# Patient Record
Sex: Male | Born: 1970 | Hispanic: Refuse to answer | Smoking: Current every day smoker
Health system: Southern US, Community
[De-identification: ages and names within clinical notes are randomized; demographics above are authoritative.]

## PROBLEM LIST (undated history)

## (undated) DIAGNOSIS — I219 Acute myocardial infarction, unspecified: Secondary | ICD-10-CM

## (undated) HISTORY — PX: CORONARY STENT PLACEMENT: SHX1402

---

## 2001-08-08 ENCOUNTER — Emergency Department (HOSPITAL_COMMUNITY): Admission: EM | Admit: 2001-08-08 | Discharge: 2001-08-08 | Payer: Self-pay | Admitting: Emergency Medicine

## 2005-12-12 ENCOUNTER — Inpatient Hospital Stay (HOSPITAL_COMMUNITY): Admission: EM | Admit: 2005-12-12 | Discharge: 2005-12-14 | Payer: Self-pay | Admitting: Emergency Medicine

## 2005-12-12 ENCOUNTER — Ambulatory Visit: Payer: Self-pay | Admitting: Internal Medicine

## 2005-12-13 ENCOUNTER — Encounter: Payer: Self-pay | Admitting: Cardiovascular Disease

## 2005-12-25 ENCOUNTER — Ambulatory Visit: Payer: Self-pay | Admitting: Cardiovascular Disease

## 2006-03-10 ENCOUNTER — Emergency Department (HOSPITAL_COMMUNITY): Admission: EM | Admit: 2006-03-10 | Discharge: 2006-03-10 | Payer: Self-pay | Admitting: Emergency Medicine

## 2006-03-12 ENCOUNTER — Observation Stay (HOSPITAL_COMMUNITY): Admission: AD | Admit: 2006-03-12 | Discharge: 2006-03-13 | Payer: Self-pay | Admitting: Otolaryngology

## 2006-03-19 ENCOUNTER — Ambulatory Visit: Payer: Self-pay | Admitting: Cardiovascular Disease

## 2007-06-19 ENCOUNTER — Ambulatory Visit: Payer: Self-pay | Admitting: Cardiology

## 2007-06-19 ENCOUNTER — Encounter: Payer: Self-pay | Admitting: Emergency Medicine

## 2007-06-19 ENCOUNTER — Inpatient Hospital Stay (HOSPITAL_COMMUNITY): Admission: AD | Admit: 2007-06-19 | Discharge: 2007-06-21 | Payer: Self-pay | Admitting: Cardiology

## 2007-06-26 ENCOUNTER — Ambulatory Visit: Payer: Self-pay

## 2007-07-24 ENCOUNTER — Ambulatory Visit: Payer: Self-pay | Admitting: Cardiology

## 2007-11-13 ENCOUNTER — Emergency Department (HOSPITAL_COMMUNITY): Admission: EM | Admit: 2007-11-13 | Discharge: 2007-11-13 | Payer: Self-pay | Admitting: Emergency Medicine

## 2007-11-17 ENCOUNTER — Inpatient Hospital Stay (HOSPITAL_COMMUNITY): Admission: EM | Admit: 2007-11-17 | Discharge: 2007-11-22 | Payer: Self-pay | Admitting: Emergency Medicine

## 2007-11-23 ENCOUNTER — Inpatient Hospital Stay (HOSPITAL_COMMUNITY): Admission: EM | Admit: 2007-11-23 | Discharge: 2007-11-26 | Payer: Self-pay | Admitting: Emergency Medicine

## 2008-12-20 ENCOUNTER — Emergency Department (HOSPITAL_COMMUNITY): Admission: EM | Admit: 2008-12-20 | Discharge: 2008-12-20 | Payer: Self-pay | Admitting: Emergency Medicine

## 2009-09-07 ENCOUNTER — Emergency Department (HOSPITAL_COMMUNITY): Admission: EM | Admit: 2009-09-07 | Discharge: 2009-09-07 | Payer: Self-pay | Admitting: Emergency Medicine

## 2009-09-09 ENCOUNTER — Emergency Department (HOSPITAL_COMMUNITY): Admission: EM | Admit: 2009-09-09 | Discharge: 2009-09-09 | Payer: Self-pay | Admitting: Emergency Medicine

## 2009-09-11 ENCOUNTER — Emergency Department (HOSPITAL_COMMUNITY): Admission: EM | Admit: 2009-09-11 | Discharge: 2009-09-11 | Payer: Self-pay | Admitting: Emergency Medicine

## 2009-09-13 ENCOUNTER — Emergency Department (HOSPITAL_COMMUNITY): Admission: EM | Admit: 2009-09-13 | Discharge: 2009-09-13 | Payer: Self-pay | Admitting: Emergency Medicine

## 2010-03-06 ENCOUNTER — Encounter: Payer: Self-pay | Admitting: Cardiovascular Disease

## 2010-04-30 LAB — DIFFERENTIAL
Eosinophils Relative: 0 % (ref 0–5)
Lymphocytes Relative: 13 % (ref 12–46)
Lymphs Abs: 1.5 10*3/uL (ref 0.7–4.0)
Monocytes Relative: 8 % (ref 3–12)

## 2010-04-30 LAB — POCT I-STAT, CHEM 8
BUN: 8 mg/dL (ref 6–23)
Chloride: 104 mEq/L (ref 96–112)
Glucose, Bld: 90 mg/dL (ref 70–99)
HCT: 43 % (ref 39.0–52.0)
Hemoglobin: 14.6 g/dL (ref 13.0–17.0)
Sodium: 143 mEq/L (ref 135–145)

## 2010-04-30 LAB — CBC
Hemoglobin: 12.5 g/dL — ABNORMAL LOW (ref 13.0–17.0)
MCH: 32 pg (ref 26.0–34.0)
MCHC: 34.9 g/dL (ref 30.0–36.0)
MCV: 91.6 fL (ref 78.0–100.0)
RBC: 3.9 MIL/uL — ABNORMAL LOW (ref 4.22–5.81)
RDW: 14 % (ref 11.5–15.5)

## 2010-04-30 LAB — RAPID STREP SCREEN (MED CTR MEBANE ONLY): Streptococcus, Group A Screen (Direct): NEGATIVE

## 2010-05-18 LAB — POCT I-STAT, CHEM 8
BUN: 12 mg/dL (ref 6–23)
Glucose, Bld: 88 mg/dL (ref 70–99)
Sodium: 136 mEq/L (ref 135–145)
TCO2: 30 mmol/L (ref 0–100)

## 2010-05-18 LAB — POCT CARDIAC MARKERS: Troponin i, poc: <0.05 ng/mL (ref 0.00–0.09)

## 2010-06-28 NOTE — Discharge Summary (Signed)
Aaron Barajas, Aaron Barajas NO.:  [id number]   MEDICAL RECORD NO.:  [id number]          PATIENT TYPE:  INP   LOCATION:  1519                         FACILITY:  [facility]   PHYSICIAN:  Theodosia Paling, MD    DATE OF BIRTH:  Jun 28, 1970   DATE OF ADMISSION:  11/23/2007  DATE OF DISCHARGE:  11/26/2007                               DISCHARGE SUMMARY   ADMITTING HISTORY:  Please refer to admission note dictated by Dr. Lucita Ferrara on the 10th of October 2009.   HOSPITAL COURSE:  The following issues were addressed during the  hospitalization.  1. Pneumococcal pneumonia with positive bacteremia, documented last      admission.  I started the patient on combination of ceftriaxone and      Levaquin.  The patient had a very high bacterial load in the sputum      as well as he had active bacteremia recently and he was coming with      the high fever and chills and leukocytosis.  The patient's WBC      count and fever normalized.  At the time of discharge he is no more      febrile.  He was hemodynamically stable.  He feels significantly      better.  I am discharging him home on a high dose of Levaquin 750      mg p.o. daily to be completed for 12 days.  He is going to also      receive pneumococcal vaccination at the time of discharge.  Also      the patient has significantly better appetite.  2. Clostridium difficile colitis.  The patient had diarrhea at the      time of admission.  A Clostridium difficile toxin was performed on      stool which was positive.  He was started on Flagyl.  His diarrhea      has resolved.  He will complete 8 more days of Flagyl for the      completion of the 10 days of therapy for C. diff colitis.  3. CAD.  His CAD stayed stable throughout the hospitalization.  4. Hypoalbuminemia.  The patient was found to have a very low albumin,      most likely secondary to the high catabolic state secondary to      pneumococcal pneumonia.  He was started on Ensure  supplements.  I      recommend him to continue this even at home.   DISCHARGE MEDICATIONS:  At the time of discharge the patient is going  home on the following medications:  1. Levaquin 750 mg p.o. daily for 12 days.  2. Flagyl 500 mg p.o. q.8 hours for the next 8 days.  3. Ensure one can every 8 hours.  4. Tylenol 650 mg p.o. q.6-8 hours as needed.  5. Continue home medication Tricor 145 mg p.o. daily.   DIAGNOSES:  1. Pneumococcal pneumonia with a history of positive bacteremia.  2. Clostridium difficile colitis.  3. Malnutrition.  4. hyperlipidemia history.   IMAGING PERFORMED:  Chest x-ray done on  11/23/07 showing bilateral  pneumonia.   PROCEDURE PERFORMED:  None.   CONSULTATIONS:  none.   DISPOSITION:  The patient does not have a primary care physician at this  time.  He has applied for Medicaid.  Will involve case manager to  establish primary care outpatient appointment for him with whom he will  followup in one week time with repeat CBC and clinical exam followup.   Total length spent on care and discharge of this patient around 45  minutes.      Theodosia Paling, MD  Electronically Signed     NP/MEDQ  D:  11/26/2007  T:  11/26/2007  Job:  [id number] [id number]

## 2010-06-28 NOTE — Discharge Summary (Signed)
[name], Aaron Barajas NO.:  [id number]   MEDICAL RECORD NO.:  [id number]          PATIENT TYPE:  INP   LOCATION:  2027                         FACILITY:  [facility]   PHYSICIAN:  Aaron Fells. Excell Seltzer, MD  DATE OF BIRTH:  [birth year]   DATE OF ADMISSION:  06/19/2007  DATE OF DISCHARGE:  06/21/2007                               DISCHARGE SUMMARY   PRIMARY CARDIOLOGIST:  Aaron Fells. Excell Seltzer, MD   PRIMARY CARE Aaron Barajas:  None.   DISCHARGE DIAGNOSIS:  Chest pain.   SECONDARY DIAGNOSES:  1. Coronary artery disease, status post percutaneous coronary      intervention and stenting of left circumflex with bare metal stent      in November 2007.  2. History of ruptured lumbar disk status post surgery.  3. Ongoing tobacco abuse.   ALLERGIES:  PENICILLIN.   PROCEDURE:  Left heart cardiac catheterization.   HISTORY OF PRESENT ILLNESS:  A 40 year old African American male with a  prior history of CAD who presented to the [facility] ED on Jun 19, 2007  with a 3-week history of episodic chest discomfort that occurred with  exertion and resolved with rest.  In the ED, ECG showed no acute changes  and the patient was admitted for further evaluation.   HOSPITAL COURSE:  The patient ruled out for MI and underwent left heart  cardiac catheterization on Jun 20, 2007 revealing 40%-50% stenosis in the  left main as well as diffusely throughout the right coronary artery.  Previously placed circumflex stent was widely patent and LV function was  normal.  There were no targets intervention.  The patient to manage  medically.  We have arranged for outpatient exercise Myoview take place  on Jun 25, 2007, at 7:30 a.m. at [facility] to evaluate the ischemic  burden of the left main and right coronary artery stenosis.  The patient  did not had any recurrent chest discomfort.  He has been ambulating  without difficulty and being discharged home today in condition.   DISCHARGE LABS:  Hemoglobin  13.0, hematocrit 38.2, WBC 8.4, platelets  146, MCV 92.4, sodium 140, potassium 3.6, chloride 104, CO2 28, BUN 7,  creatinine 0.8, glucose 77, INR 1.1.  Cardiac markers negative x3.  Calcium 8.8.   DISPOSITION:  The patient is being discharged home today in good  condition.   FOLLOWUP PLANS AND APPOINTMENTS:  We have arranged for followup exercise  Myoview on Jun 25, 2007 at 7:30 a.m.  We will also arrange for followup  with Dr. Excell Barajas in roughly 2 weeks.   DISCHARGE MEDICATIONS:  1. Aspirin 81 mg daily.  2. Zocor 40 mg nightly.   OUTSTANDING LAB STUDIES:  None.   DURATION DISCHARGE Neta Mends:  Forty five minutes including physician  time.      Aaron Barajas, ANP      Aaron Fells. Excell Seltzer, MD  Electronically Signed    CB/MEDQ  D:  06/21/2007  T:  06/22/2007  Job:  [id number]

## 2010-06-28 NOTE — Discharge Summary (Signed)
NAMEJOSEALBERTO, Aaron Barajas              ACCOUNT NO.:  [id number]   MEDICAL RECORD NO.:  [id number]          PATIENT TYPE:  INP   LOCATION:  1417                         FACILITY:  [facility]   PHYSICIAN:  Hillery Aldo, M.D.   DATE OF BIRTH:  April 15, 1970   DATE OF ADMISSION:  11/17/2007  DATE OF DISCHARGE:  11/22/2007                               DISCHARGE SUMMARY   PRIMARY CARE PHYSICIAN:  None.   CARDIOLOGIST:  Dr. Excell Seltzer.   DISCHARGE DIAGNOSES:  1. Streptococcal pneumonia sepsis.  2. Multilobar pneumonia.  3. Hypertriglyceridemia.  4. Moderate protein calorie malnutrition.  5. Coronary artery disease.   DISCHARGE MEDICATIONS:  1. Aspirin 81 mg daily.  2. Avelox 400 mg daily times 10 days.  3. Tricor 145 mg daily.   CONSULTATIONS:  None.   BRIEF ADMISSION HPI:  The patient is a 40 year old male who presented to  the hospital with a chief complaint of fevers, chills, and weight loss.  He had been sick for approximately 3 weeks prior to presentation and his  temperature max was noted to be 104.  He also complained of a poor  appetite and little p.o. intake.  He was admitted for further evaluation  and workup.  For the full details, please see the dictated report done  by Dr. Ashley Royalty.   PROCEDURES AND DIAGNOSTIC STUDIES:  1. Chest x-ray on November 17, 2007 showed asymmetrical bilateral air      space consolidation consistent with pneumonia.  2. Repeat chest x-ray on November 20, 2007 showed stable multilobar      pneumonia with a small left pleural effusion.   DISCHARGE LABORATORY VALUES:  Sodium was 137, potassium 4.4, chloride  105, bicarb 23, BUN 6, creatinine 0.82, glucose 97.  White blood cell  count was 14.5, hemoglobin 11.6, hematocrit 33.3, platelets 343.  HIV  antibody screen was nonreactive.  TSH was 0.618.   HOSPITAL COURSE:  1. Multilobar pneumonia with sepsis:  The patient was admitted and      blood cultures were obtained x2.  He subsequently grew out two of  two positive for streptococcal pneumonia.  The patient had clinical      pneumonia and radiographic pneumonia as well.  He was empirically      put on Avelox due to an underlying penicillin allergy and within 3      days of his hospitalization began to feel much better.  His fevers      decreased and ultimately he has been afebrile since November 20, 2007.  He feels much better.  At this point, he is on day 5 out of      a planned course of 14 days of therapy with Avelox.  He is      instructed to return to the hospital for any non resolution of      symptoms.  2. Hypertriglyceridemia:  The patient did have a fasting lipid panel      done during the course of his hospital stay which showed a total      cholesterol of 147 and triglycerides of 430.  HDL was 36 and LDL      was not calculable.  Because of his history of coronary artery      disease he was put on Tricor.  3. Moderate protein calorie malnutrition:  The patient had poor      appetite with decreased p.o. intake for several weeks prior to      presentation.  Was seen in consultation with dietitian and put on      Ensure supplements during his hospital stay.  At this time his      appetite has improved dramatically.  4. Coronary artery disease:  The patient was maintained on aspirin      therapy.  He did not have any complaints of chest pain during this      hospital stay.  Nevertheless, cardiac markers were cycled q.8 h. x3      during his hospital stay and were negative.   DISPOSITION:  The patient is medically stable and wishes to return home.  He is instructed to follow up with Dr. Excell Seltzer as needed.  He is  instructed to return to the emergency department for any non resolution  of symptoms, recurrent fever, or worsening dyspnea.   Time spent on discharge planning and coordination of care equal 30  minutes.      Hillery Aldo, M.D.  Electronically Signed     CR/MEDQ  D:  11/22/2007  T:  11/23/2007  Job:   [id number]   cc:   Veverly Fells. Excell Seltzer, MD  [address]  [place], [place] [address]

## 2010-06-28 NOTE — H&P (Signed)
NAMEDINESH, ULYSSE NO.:  [id number]   MEDICAL RECORD NO.:  [id number]          PATIENT TYPE:  EMS   LOCATION:  ED                           FACILITY:  [facility]   PHYSICIAN:  Lucita Ferrara, MD         DATE OF BIRTH:  01-30-1971   DATE OF ADMISSION:  11/23/2007  DATE OF DISCHARGE:                              HISTORY & PHYSICAL   The patient it is a 40 year old male with a coronary artery disease who  presents to [facility] with a chief complaint of fever.  The patient felt warm, took his temperature and it was elevated at 101.  The patient was recently discharged from the hospital with 5-day  hospitalization for multilobar Streptococcal pneumonia; through the  hospital course, he had grown 2 positive blood cultures for strep  pneumonia.  He was empirically started on Avelox secondary to underlying  penicillin allergy.  He was discharged afebrile, feeling much better.  He was also found to have some protein calorie malnutrition secondary to  decrease in his appetite.  Fevers; he denies any cough, pleuritic chest  pain, denies any urinary frequency, urgency or burning.  He does say  that he had some loose stools, but denies foul-smelling diarrhea or  bloody movements.  He denies any neck pain, photophobia, ulcerations or  alterations in his mental status.  Otherwise 12-point review of systems  is negative.   PAST MEDICAL HISTORY:  Significant for:  1. Coronary artery disease, status post stent placement.  2. History of the recent streptococcal pneumonia sepsis, as above.  3. Hypertriglyceridemia.  4. Protein calorie malnutrition.  5. Coronary artery disease.   MEDICATIONS AT HOME:  1. Aspirin 81 grams daily.  2. He is taking Avelox 400 mg daily now.  3. Tricor 145 mg p.o. daily.   ALLERGIES:  Hives with PENICILLIN.   PHYSICAL EXAMINATION:  Generally speaking, the patient is in no acute  distress.  His blood pressure on evaluation in the  emergency room is currently  103/47.  HEENT:  Normocephalic, atraumatic.  Sclerae is anicteric.  Neck is  supple.  No JVD or carotid bruits.  PERRLA.  Extraocular muscles intact.  CARDIOVASCULAR:  S1 and S2, regular rate and rhythm.  No murmurs, rubs  or clicks.  LUNGS:  Clear to auscultation bilaterally.  No rhonchi, rales or  wheezes.  ABDOMEN: Soft, nontender, nondistended.  Positive bowel sounds.  EXTREMITIES: No clubbing, cyanosis or edema.   EMERGENCY ROOM STUDIES:  The patient had a CBC which shows a white count  22.4, hemoglobin of 12.5, hematocrit of 35.2, and platelet count of 612.  Complete metabolic panel shows a BUN of 7, creatinine 1.39.  Besides  that, his AST and ALT and alk phos were 40, 65, and 93 respectively.  Note that when he left November 21, 2007, his white count was 14.5,  hemoglobin was 11.6, hematocrit was 33.3 and platelets were 343.  Chest  x-ray shows improving bilateral pneumonia.   ASSESSMENT/PLAN:  A 40 year old who:  1. Presented with fevers and with a recent admission for streptococcal  pneumonia septicemia.  The patient is mildly hypotensive here. We      will need to rule out sepsis, initiate sepsis protocol. The patient      also had bilateral improving pneumonia on chest x-ray.  2. Coronary artery disease:  No EKG changes, no chest pain currently.  3. Mild diarrhea, rule out Clostridium difficile.   We will go ahead and admit the patient to the step-down unit.  We will  initiate IV fluids, strict input and output, pressors if patient is not  responding to fluids.  We will initiate broad-spectrum antibiotics with  vancomycin and Levaquin.  Blood cultures x2, UA, urine cultures, IV  fluids as above.  Lactic acid. The rest of the plans depend on his  progress.      Lucita Ferrara, MD  Electronically Signed     RR/MEDQ  D:  11/23/2007  T:  11/23/2007  Job:  [id number]

## 2010-06-28 NOTE — Cardiovascular Report (Signed)
NAMEAGAPITO, HANWAY NO.:  [id number]   MEDICAL RECORD NO.:  [id number]          PATIENT TYPE:  INP   LOCATION:  2027                         FACILITY:  [facility]   PHYSICIAN:  Arturo Morton. Riley Kill, MD, FACCDATE OF BIRTH:  07/24/1970   DATE OF PROCEDURE:  06/20/2007  DATE OF DISCHARGE:                            CARDIAC CATHETERIZATION   INDICATIONS:  Aaron Barajas is a 40 year old who presents with some  recurrent angina.  The current study was done because of that.  He has  previously had intravascular ultrasound of the left main, and a stent  placed to the circumflex coronary artery.  The risks, benefits and  alternatives were discussed with the patient and he consented to  proceed.   PROCEDURE:  1. Left heart catheterization.  2. Selective coronary arteriography.  3. Selective left ventriculography.   DESCRIPTION OF PROCEDURE:  The patient was brought to the cath lab and  prepped and draped in the usual fashion when the anterior puncture of  the femoral artery was easily entered.  A 5-French sheath was placed  using left and right coronaries, obtained in multiple angiographic  projections.  Intracoronary nitroglycerin was then given down the right  coronary.  Following this, central aortic and left ventricular pressures  were measured.  Pigtail ventriculography was performed in the RAO  projection.  The ACT was moderately high so the femoral sheath was sewn  in place until the ACT fell under 175.  There were no complications.  I  then reviewed the old films and the current films in detail.   HEMODYNAMIC DATA:  1. Central aortic pressure is 113/72, mean 90.  2. Left ventricular pressure 123/11.  3. There is no gradient pullback across the aortic valve.   ANGIOGRAPHIC DATA:  1. Ventriculography was done in the RAO projection.  Overall systolic      function is preserved.  No definite wall motion abnormalities were      identified.  2. There is about a 40-50%  area of eccentric plaquing in the left      main.  Importantly, there is absolutely no damping with the      diagnostic catheters.  It appears to be similar to the previous      study of 2007.  3. The left anterior descending artery courses to the apex crossing      the origin of the diagonal.  There is probably about 30-40%      eccentric plaquing in tandem locations.  The remainder of the      vessels without critical narrowing.  4. The circumflex has been previously stented.  The stent is widely      patent without significant narrowing.  5. The right coronary artery is diffusely diseased in its midsection.      On the previous study, it was not expanded with nitroglycerin.  It      demonstrates multiple areas of 40% to 50% narrowing throughout the      midvessel in a tandem fashion.  Does appear to be diffusely      segmental.  The residual lumen,  however, appears to be at least      about 2 mm or more.  It terminates as an acute marginal, in the      posterior descending system.  6. The ventriculogram demonstrates preserved global systolic function      without segmental wall motion abnormality.   CONCLUSION:  1. Preserved overall left ventricular function.  2. No evidence of significant restenosis.  3. Residual left main disease, with 40% ostial narrowing that is not      hemodynamically obstructive.  Please see previous IVUS of 2007.  4. Multiple tandem lesions in the midportion of the right coronary      artery over a diffuse segment.   DISPOSITION:  The patient has preserved LV function.  His stent is  widely patent.  The right has diffuse lesions in the midportion.  The  patient absolutely must stop smoking.  I would favor outpatient stress  testing with Myoview.  If there is inferior ischemia then this could be  stented, but none of the lesions looked extraordinarily tight and  aggressive medical therapy would be preferable given the length.  I will  have Dr. Excell Seltzer  review the films.      Arturo Morton. Riley Kill, MD, [facility]  Electronically Signed     TDS/MEDQ  D:  06/20/2007  T:  06/21/2007  Job:  [id number]   cc:   Veverly Fells. Excell Seltzer, MD

## 2010-06-28 NOTE — Assessment & Plan Note (Signed)
[facility]                            CARDIOLOGY OFFICE NOTE   NAME:Aaron Barajas, Aaron Barajas                     MRN:          [id number]  DATE:07/24/2007                            DOB:          [birth year]    PRIMARY CARDIOLOGIST:  Veverly Fells. Excell Seltzer, MD   HISTORY:  This is a 40 year old African American male patient with  history of coronary artery disease, status post PCI of the left  circumflex with a bare-metal stent in November 2007.  He came to the  hospital with 3-week history of episodic chest discomfort and underwent  repeat cath on Jun 20, 2007.  This revealed a 40-50% stenosis in the left  main as well as diffuse disease throughout the RCA.  His previously  placed circumflex stent was widely patent and LV function was normal.  He was referred for a stress Myoview to rule out any ischemia in that  area.  The patient exercised 14 minutes 30 seconds without chest pain or  ST changes, and the Myoview scan was normal.  EF 54%.   The patient has had no recurrent chest pain.  He is actually running a  mile every evening without problems.  He has not had to use  nitroglycerin.  He works outside doing landscape work and is feeling  well.   CURRENT MEDICATIONS:  1. Aspirin 81 mg daily.  2. Zocor 40 mg daily.   PHYSICAL EXAMINATION:  A pleasant thin 40 year old African American male  in no acute distress.  Blood pressure 122/70, pulse 51, and weight 142.  NECK:  Without JVD, HJR, bruit, or thyroid enlargement.  LUNGS:  Clear anterior, posterior, and lateral.  HEART:  Regular rate and rhythm at 50 beats per minute.  Normal S1 and  S2.  No murmur, rub, bruit, thrill, or heave noted.  ABDOMEN:  Soft without organomegaly, masses, lesions, or abnormal  tenderness.  Right groin without hematoma or hemorrhage.  EXTREMITIES:  Without cyanosis, clubbing, or edema.   EKG, sinus bradycardia with LVH and nonspecific ST changes.   IMPRESSION:  1. Coronary  artery disease status post bare-metal stent to the      circumflex in November 2007.  2. Recent cath on Jun 20, 2007.  Patent circumflex stent with 40% to      50% ostial narrowing of the left main; multiple tandem lesions in      the midportion of the right coronary artery, 40% to 50%; normal      left ventricular function.  3. Stress Myoview on Jun 26, 2007, normal.  4. Hyperlipidemia.   PLAN:  At this time, the patient is stable from a cardiac standpoint.  He has had no further chest pain.  We will have him follow up with Dr.  Excell Seltzer in 4-6 months.      Jacolyn Reedy, PA-C  Electronically Signed      Jonelle Sidle, MD  Electronically Signed   ML/MedQ  DD: 07/24/2007  DT: 07/24/2007  Job #: [id number]

## 2010-06-28 NOTE — H&P (Signed)
NAMEOLLIVANDER, SEE NO.:  [id number]   MEDICAL RECORD NO.:  [id number]          PATIENT TYPE:  INP   LOCATION:  0101                         FACILITY:  [facility]   PHYSICIAN:  Altha Harm, MDDATE OF BIRTH:  05-11-1970   DATE OF ADMISSION:  11/17/2007  DATE OF DISCHARGE:                              HISTORY & PHYSICAL   CHIEF COMPLAINT:  Fevers, chills, weight loss.   HISTORY OF PRESENT ILLNESS:  This is a 40 year old gentleman with the  past medical history of coronary artery disease status post stents who  presents to emergency for the second time in 2 weeks with complaints of  fevers, chills and weight loss.  According to the patient and his wife  approximately 3 weeks ago he was noted to have fevers which has  persisted over the last 3 weeks.  His T-max has been 104.  His appetite  has been very poor with very poor oral intake for both liquids and  solids.  The patient has had according to his wife approximately a 15  pound weight loss within the past month.  The patient denies any nausea,  vomiting or diarrhea.  He does admit, however, to fevers and chills  particularly at night.  He does admit to night sweats.  The patient  denies any sick contacts, any recent travel.  He works as a Administrator  but has not been exposed to any unusual toxins in the past several  months.  The patient also describes myalgias as a part of his prodrome  and syndrome.  However, interestingly the patient does not report cough  as a significant feature of his symptoms.  We are asked to see this  patient for this persistent illness and the fact that he is dehydrated  with high fevers.   PAST MEDICAL HISTORY:  1. Significant as noted above for coronary artery disease.  2. Status post PTCA in 2008.  The patient states that these were non-      drug-eluting stents.   FAMILY HISTORY:  The patient denies any significant family history.   SOCIAL HISTORY:  The patient resides  with his wife.  He has no tobacco,  alcohol or drug use.  He works as a Administrator.  His sexual history is  that he has a single partner and his wife does not use protection.  The  patient has never been tested for HIV.  His wife's name is Jill Side and  can be contacted at [phone].   CURRENT MEDICATIONS:  Include the following, aspirin enteric-coated 81  mg p.o. daily.   ALLERGIES:  Are to PENICILLIN.   PRIMARY CARE PHYSICIAN:  None.   REVIEW OF SYSTEMS:  The patient denies any dizziness, loss of  consciousness, seizure activity.  Denies any blurring of vision.  He  denies any pain with eating but does describe a poor appetite.  The  patient denies any joint pains or rashes.  RESPIRATORY:  See the HPI.  CARDIOVASCULAR:  He denies any palpitations, chest pains.  ABDOMEN:  The  patient denies any nausea, vomiting.  He denies any abdominal  pain, any  constipation, diarrhea.  GENITOURINARY:  He denies any penile discharge,  any frequency, urgency or dysuria.  The patient denies any loss of hair.  He has been having fever and chills in terms of temperature instability  and of course the described weight loss.  All other systems are negative  except as noted in the HPI.   STUDIES:  In the emergency room; please note that the hemogram has not  been available at the time of my evaluation of the patient.  His sodium  is 133, potassium 3.4, chloride 94, bicarb 26, BUN 6, creatinine 1.16,  albumin is low at 2.6.  A chest x-ray performed shows multilevel  pneumonia.   PHYSICAL EXAMINATION:  VITAL SIGNS:  Vital signs are as follows, heart  rate ranging between 97 and 115, temperature on arrival was 101 and it  is now 98.8.  Blood pressure 119/86, respiratory rate 16, O2 sats are  97% on room air.  GENERAL:  In general this is a cachectic-appearing male who appears in  no acute distress.  However, he does appear to be listless and of low  energy, however, not lethargic in his mentation.   HEENT:  He is normocephalic, atraumatic.  The patient has a ptosis over  the right eyelid although slight.  Pupils are equal, round and reactive  to light and accommodation.  Fundi are benign.  Extraocular movements  are intact.  No conjunctival pallor.  No icterus.  Nasal mucosa is  boggy.  There are no polyps identified by gross examination.  Oropharynx  is tacky.  No exudate, erythema or lesions are noted.  The patient has  poor dentition in the anterior portion of the mouth.  Otherwise normal.  NECK:  The trachea is midline.  There are no masses, no thyromegaly, no  JVD, no carotid bruit.  RESPIRATORY:  The patient has normal respiratory effort without the use  of accessory muscles.  He is clear to auscultation.  There is no  wheezing, rales or rhonchi noted.  He has got equal and normal excursion  bilaterally.  He is clear to auscultation.  There is no increased vocal  fremitus.  There is no dullness to percussion noted.  CARDIOVASCULAR:  He is mildly tachycardic.  He has normal S1 and S2.  No  murmurs, rubs or gallops noted.  PMI is nondisplaced.  No heaves or  thrills on palpation.  ABDOMEN:  The abdomen is soft, nontender, nondistended.  No masses, no  hepatosplenomegaly.  He has normoactive bowel sounds.  LYMPH NODE SURVEY:  He has got no cervical, axillary or inguinal  lymphadenopathy noted.  MUSCULOSKELETAL:  Again, the patient is cachectic and emaciated.  He  does have some muscle wasting in the interdigital muscles of both hands.  I am unable to identify any temporal wasting.  He has no warmth,  swelling or erythema around the joints and he has no spinal tenderness  noted.  NEUROLOGICAL:  Cranial nerves II-XII are grossly intact.  Sensation is  intact to light touch and proprioception.  DTRs are 2+ bilaterally upper  and lower extremities.  The patient has symmetric and equal strength in  bilateral upper and lower extremities and his greatest strength is 4+/5  in both upper  and lower extremities.  PSYCHIATRIC:  He is alert and oriented x3.  He has good insight and  cognition with recent and remote recall.   ASSESSMENT AND PLAN:  This is a patient who presents with:  1. Multilobar pneumonia acquired in the community.  2. Weight loss.  3. Flu-like symptoms.   This patient has an obvious multilobar pneumonia and will be treated  with Avelox and vancomycin for his pneumonia.  The patient does have an  allergy to penicillin and I will avoid Zosyn and Rocephin at this time.  Given the patient's weight loss I think it is only prudent to get an HIV  test on him to eliminate that as a cause for immunocompromise in this  patient.  However, given the symptomatology of this patient I think it  is reasonable to also check a TSH to ensure that he is not having some  thyroid dysfunction as a contributing factor to this.  The patient also  has dehydration and will be given aggressive fluid resuscitation.   In terms of his electrolyte imbalance with hyponatremia this is likely  resultant from his dehydration and should correct with aggressive  hydration.  We will continue to follow that.  The patient also has a  mild hypokalemia thus his maintenance IV fluids will contain potassium.  I expect that this will correct quickly as the patient's appetite  improves thus I see no need to add supplemental oral potassium at this  time.  We will check the magnesium and phosphorus on this patient to  ensure his electrolyte stability.  In terms of his history of coronary  artery disease he will be maintained on his aspirin.  Blood cultures  have been sent on this patient and we will await the results of this  culture.  Further diagnostic studies and treatments will be predicated  upon the results of the initial studies and the patient's response to  therapy.  I will follow up on the patient's hemogram that is not yet  available and will make any  further decisions that are necessary  based upon that.  The patient also  has an albumin of 2.6 which is extremely unusual for a patient of this  youth and I will ask nutrition services to do an evaluation and assist  with the planning of his diet.      Altha Harm, MD  Electronically Signed     MAM/MEDQ  D:  11/17/2007  T:  11/17/2007  Job:  [id number]

## 2010-07-01 NOTE — Discharge Summary (Signed)
[name], [name]              ACCOUNT NO.:  [id number]   MEDICAL RECORD NO.:  [id number]          PATIENT TYPE:  INP   LOCATION:  6532                         FACILITY:  [facility]   PHYSICIAN:  Veverly Fells. Excell Seltzer, MD  DATE OF BIRTH:  November 16, 1970   DATE OF ADMISSION:  12/13/2005  DATE OF DISCHARGE:  12/14/2005                                 DISCHARGE SUMMARY   PROCEDURES:  1. Cardiac catheterization.  2. Coronary arteriogram.  3. Left ventriculogram.  4. PTCA and bare metal stent to one vessel.   PRIMARY DIAGNOSIS:  Acute coronary syndrome, peak troponin I 0.15 with 95%  circumflex stent on this admission.   SECONDARY DIAGNOSIS:  1. Hyperlipidemia with a total cholesterol 205, triglycerides 165, HDL 35,      LDL 137, medication added this admission.  2. History of tobacco use, cessation encouraged.  3. History of back surgery.   TIME OF DISCHARGE:  37 minutes.   HOSPITAL COURSE:  Aaron Barajas is a 40 year old male with no previous  history of coronary artery disease.  He had chest pain off and on for 4 days  and the pain became worse with exertion, relieved by rest.  He came to the  emergency room and was admitted for further evaluation and treatment.   The 2-D echocardiogram was performed which showed an EF of 60% without wall  motion abnormalities and no significant valvular abnormalities.  Chest x-ray  showed no active disease.  His troponin elevated to 0.15 and Dr. Antoine Poche  felt that cardiac catheterization was indicated, so this was performed on  December 13, 2005.   The cardiac catheterization showed a 95% circumflex which was treated with  PTCA and a bare metal stent.  He had 40% stenosis in the left main which was  an ostial lesion and a 40% stenosis in the distal circumflex as well as a  30% and 40% stenosis in the RCA.  Intravascular ultrasound was performed on  the left main which showed only mild plaque.   A bare metal stent was used on the circumflex reducing  that stenosis to  zero.   Postprocedure, his cardiac enzymes normalized.  Smoking cessation consult  was called and cardiac rehab saw the patient as well.  Cardiac risk factor  reduction and a heart-healthy lifestyle were discussed with the patient.  Because cost is an issue, we have made an effort to help him obtain Plavix  from the hospital as well as a 2-week card from the manufacturer and some  office samples.  For cost savings his Statin will come from [employer].   On December 14, 2005, Aaron Barajas was ambulating without chest pain or  shortness of breath.  He was motivated to quit tobacco and given information  on cardiac risk factor reduction and lifestyle modifications.  He was  evaluated by Dr. Excell Seltzer and considered stable for discharge and outpatient  follow-up arranged.   DISCHARGE INSTRUCTIONS:  His activity level is to be increased gradually.  He is to call our office for any problems with the cath site.  He is to  follow up  with Dr. Excell Seltzer on November 12 at 1:45 and he is encouraged to  obtain a family care physician.  He is not to smoke.  Of note, a beta  blocker was added to his medication regimen, but he never received any doses  because his heart rate was occasionally dropping into the 30s and staying in  the 40s without any rate lowering medications at all.   DISCHARGE MEDICATIONS:  1. Coated aspirin 325 mg daily.  2. Plavix 75 mg a day for a month.  3. Nitroglycerin sublingual p.r.n.  4. Simvastatin 80 mg daily p.m.      Theodore Demark, PA-C      Veverly Fells. Excell Seltzer, MD  Electronically Signed    RB/MEDQ  D:  12/14/2005  T:  12/14/2005  Job:  [id number]   cc:   Veverly Fells. Excell Seltzer, MD

## 2010-07-01 NOTE — H&P (Signed)
NAMEFOXX, KLARICH NO.:  [id number]   MEDICAL RECORD NO.:  [id number]          PATIENT TYPE:  EMS   LOCATION:  ED                           [facility]ACILITY:  [facility]   PHYSICIAN:  Doylene Canning. Ladona Ridgel, MD    DATE OF BIRTH:  1970-06-15   DATE OF ADMISSION:  12/12/2005  DATE OF DISCHARGE:                                HISTORY & PHYSICAL   CARDIOLOGY ADMISSION NOTE   ADMITTING DIAGNOSIS:  Chest pain.   SECONDARY DIAGNOSIS:  Tobacco abuse.   HISTORY OF PRESENT ILLNESS:  The patient is a 40 year old man who has a  history of longstanding tobacco use (approximately 20-30 pack-year smoking  history) who was in his usual state of health until 4 days ago when he noted  a right-sided chest pain which occurred at rest and was associated with arm  heaviness.  His symptoms waxed and waned.  There is no clear relationship to  exertion.  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  There was some  diaphoresis.  There was some dyspnea.  The patient has waxed-and-waned since  then.  In the emergency department, he received an EKG which demonstrated  sinus bradycardia with no EKG changes.  He was admitted for additional  evaluation.  His pain is moderate-to-severe in intensity.  It is dull and  not sharp.  There were no clear relieving factors.  The patient has not had  any vomiting.  He has had no nausea.   PAST MEDICAL HISTORY:  Notable for history of ruptured lumbar disk at age 62  which occurred while running track.  Otherwise he had no significant  history.   FAMILY HISTORY:  Negative for premature coronary disease.  Both his parents  are alive and well and in good condition.   SOCIAL HISTORY:  The patient smokes 1-1/2 packs of cigarettes per day.  He  denies alcohol or other recreational drug use.   REVIEW OF SYSTEMS:   Negative except as noted in the HPI.  All systems  reviewed and found to be negative.   PHYSICAL EXAMINATION:  GENERAL:  He is a pleasant well-appearing young man  in no acute distress.  VITAL SIGNS:  Blood pressure was 112/75.  The pulse was 55 and regular.  The  oxygen saturation was 98%.  HEENT: Normocephalic and atraumatic.  Pupils were equal and round.  Oropharynx moist.  Sclerae were anicteric.  NECK:  Revealed no jugular distension.  There is no thyromegaly.  Trachea is  midline.  Carotids were 2+ and symmetric.  LUNGS:  Clear bilaterally to auscultation.  There were no wheezes, rales, or  rhonchi.  There was no increased work of breathing.  CARDIOVASCULAR EXAM:  Regular rate and rhythm with a normal S1 and S2.  The  PMI was not enlarged nor was it laterally displaced.  There was no S3 and no  S4 gallop appreciated.  ABDOMINAL EXAM:  Soft, nontender, nondistended.  There was no organomegaly.  Bowel sounds were present.  There was no rebound or guarding.  EXTREMITIES:  Demonstrated no cyanosis, clubbing or edema.  The pulses were  2+ and symmetric.  SKIN:  Exam was normal.  NEUROLOGIC:  Exam alert and oriented x3.  Cranial nerves intact.  Strength  was 5/5 and symmetric.  MUSCULOSKELETAL:  The joint exam was normal.   EKG:  Demonstrates sinus bradycardia, normal axis, and intervals.   LAB DATA:  Not immediately available.   CHEST X-RAY:  Also pending.   IMPRESSION:  1. Chest pain x4 days off-and-on, worse today.  2. Ongoing tobacco abuse.   DISCUSSION:  I discussed the options with the patient.  I have recommended  that he be admitted to the hospital for observation and serial cardiac  enzymes and EKGs.  We will encourage smoking cessation.  We will obtain a 2-  D echo and a regular exercise stress test, if his cardiac enzymes are  negative.  If they are positive, then catheterization would be in order.           ______________________________  Doylene Canning. Ladona Ridgel,  MD     GWT/MEDQ  D:  12/12/2005  T:  12/13/2005  Job:  [id number]

## 2010-07-01 NOTE — Cardiovascular Report (Signed)
NAMEKENTA, LASTER              ACCOUNT NO.:  [id number]   MEDICAL RECORD NO.:  [id number]          PATIENT TYPE:  INP   LOCATION:  6532                         FACILITY:  [facility]   PHYSICIAN:  Veverly Fells. Excell Seltzer, MD  DATE OF BIRTH:  10-14-1970   DATE OF PROCEDURE:  DATE OF DISCHARGE:  12/14/2005                              CARDIAC CATHETERIZATION   PROCEDURES:  1. Left heart catheterization.  2. Selective coronary angiography.  3. Left ventricular angiography.  4. IVUS of the left main coronary artery.  5. PTCA stenting.  6. IVUS of the left circumflex.  7. StarClose of the right femoral artery.   INDICATION:  Mr. Reading is a very nice 40 year old man who presents with  typical symptoms for unstable angina.  He has a history of tobacco use and  with his typical history he was referred for cardiac catheterization.   PROCEDURAL DETAILS:  Risks and indications of the procedure were explained  in detail to the patient.  Informed consent was obtained.  The right groin  was prepped, draped and anesthetized with 1% lidocaine.  Using the modified  Seldinger technique, a 6-French arterial sheath was placed into the right  common femoral artery.  Multiple angiographic views of both the right and  left coronary arteries were taken.  For the left coronary artery a 6-French  JL-4 catheter was used, for the right coronary artery a 6-French JR-4  catheter was used.  Following selective coronary angiography, an angled  pigtail catheter was inserted into the left ventricle.  A 30-degree right  anterior oblique left ventriculogram was done.  A pullback across the aorta  valve was performed.  Following the diagnostic procedure, the patient had  severe focal stenosis of his mid-left circumflex.  He also had an  indeterminate lesion in his ostial left mainstem.  I elected to IVUS his  left mainstem to rule out significant disease there.  Pending the results of  the IVUS, I have planned on  intervening on his left circumflex.   At the conclusion of the procedure, the right femoral artery was sealed with  a 6-French StarClose device.   FINDINGS:  Aortic pressure 128/69 with a mean of 94, left ventricular  pressure 128/10 with an end-diastolic pressure of 18.   The left mainstem has ostial disease.  The ostium is eccentric, but appears  to be 40% stenosed by angiography.  The remainder of the left mainstem is  angiographically normal.  The left mainstem bifurcates into the LAD and left  circumflex.  The LAD is angiographically normal throughout its course.  It  is a large caliber vessel that courses down to the left ventricular apex.  There are three diagonal branches that have no significant disease.   The left circumflex is a large diameter vessel.  It courses down the AV  groove and in its mid-portion has a 95% stenosis.  It gives off a large  obtuse marginal branch that has a 40% stenosis.  The true AV groove  circumflex becomes a small caliber vessel after the obtuse marginal branch.   The right coronary artery  is diffusely diseased.  The disease appears  nonobstructive.  The proximal vessel has a 30% stenosis and the mid-vessel  has serial 40-to-50%  lesions.  The distal right coronary artery is small  diameter and has no significant obstructive disease.  It bifurcates into the  PDA and posterior AV segment that gives off two posterolateral branches.  There is a large RV marginal branch.   The left ventriculogram performed in a 30-degree right anterior oblique  projection shows low-normal left ventricular function with a left  ventricular ejection fraction of 50%, there is no mitral regurgitation.   ASSESSMENT:  1. Severe left circumflex disease.  2. Mild-to-moderate left mainstem disease.  3. Normal left anterior descending.  4. Mild diffuse disease in the right coronary artery.  5. Normal left ventricular function.   PLAN:  I elected to perform intravascular  ultrasound of the left mainstem.  Angiomax was used for anticoagulation.  After achieving a therapeutic ACT, a  coronary guidewire was inserted into the LAD.  IVUS of the left mainstem was  performed and demonstrated only mild plaque in the ostium of the LAD.  There  was no significant obstructive disease.  Since the IVUS of the left mainstem  showed no significant disease in that area, I elected to proceed with  percutaneous coronary intervention of the left circumflex.  A coronary  guidewire was passed into the distal left circumflex without difficulty.  The lesion was pre-dilated with a 2.5 mm balloon.  A 2.5 x 12 mm Liberte  bare-metal stent was deployed with an excellent angiographic result.  There  was no residual stenosis and the stent was well expanded.  There was TIMI-3  flow.  The stent was post-dilated with a noncompliant balloon.  Following  post dilatation, there was an excellent angiographic result with TIMI-3  blood flow throughout the left circumflex system.  Patient was chest pain  free and tolerated the procedure well.   At the conclusion of the intervention, a 6-French StarClose device was  deployed to seal the right femoral arteriotomy.  The patient was given 600  mg of Plavix and transferred to the holding area.  He should remain on a  minimal of 30 days of dual antiplatelet therapy with aspirin and  clopidogrel.  He should remain on aspirin lifelong.  He will undergo risk  factor modification as well.      Veverly Fells. Excell Seltzer, MD  Electronically Signed     MDC/MEDQ  D:  12/25/2005  T:  12/25/2005  Job:  [id number]

## 2010-07-01 NOTE — Assessment & Plan Note (Signed)
[facility]                              CARDIOLOGY OFFICE NOTE   NAME:PRESSLEYOmarri, Eich                       MRN:          [id number]  DATE:12/25/2005                            DOB:          1971/01/28    Mr. Kunath was seen as an outpatient this afternoon at the [facility] and follow up after his recent hospitalization from  October 31 through November 1 of this year.  He is a very nice 40 year old  gentleman who presented with typical exertional chest pain and was  subsequently referred for cardiac catheterization because of his typical  symptoms in a crescendo pattern. His catheterization demonstrated non-  obstructive disease in his left main stem but a very high grade stenosis of  the left circumflex.  He had moderate disease throughout his right coronary  artery.  He underwent percutaneous coronary intervention and a bare metal  stent was placed in the mid circumflex with an excellent angiographic  result.   Since his discharge home, he has done very well. He has had some fleeting  atypical chest pains but no exertional pain and no symptoms that are similar  to those at the time of his presentation last month.  He describes some  generalized myalgias over the past 48 hours.  He complains of pain in his  abdominal muscles as well as his upper arm and shoulder area.  His forearms  feel sore as well.  He does not have any leg symptoms.  This is a new  complaint for him and he has not had any exacerbating injuries or new  activities.  He has no other complaints at this time.  He specifically  denies dyspnea, orthopnea, PND, light headedness, syncope or palpitations.   CURRENT MEDICATIONS:  Include:  1. Simvastatin 80 mg daily.  2. Aspirin 325 mg daily.  3. Plavix 75 mg daily.   PHYSICAL EXAMINATION:  The patient is alert and oriented.  He is in no acute  distress.  Weight is 148 pounds.  Blood pressure is 118/78.  Heart  rate is 50.  Respiratory rate is 12.  NECK:  Normal carotid upstrokes without bruits.  Jugular venous pressures  normal.  LUNGS:  Are clear to auscultation bilaterally.  CARDIOVASCULAR:  The apex is discrete and nondisplaced.  Heart is  bradycardic and regular.  There are no murmurs or gallops.  ABDOMEN:  Is soft, nontender. No organomegaly.  Positive bowel sounds.  EXTREMITIES:  No clubbing, cyanosis or edema.  Peripheral pulses are 2+ and  equal throughout.  There is very mild tenderness in the upper arms to  palpation as well as throughout the abdominal musculature.   EKG demonstrates sinus bradycardia and is otherwise normal.   Review of studies from his hospitalization show normal creatinine of 1.0,  potassium of 4.1 with a total cholesterol of 205, HDL of 35 and LDL of 137.  Echocardiogram performed during his hospitalization demonstrated normal left  ventricular size and systolic function with a left ventricular ejection  fraction of 60%.   Mr. Kyler is currently stable from a  cardiovascular standpoint.  His  current issues include the following:  1. Coronary artery disease. He is status post PCI of the left circumflex      with a bare metal stent.  He should complete 30 days of therapy with      clopidogrel and remain on aspirin life-long.  He is okay to return to      work.  He works as a Administrator and I have asked him to perform light      activities as tolerated, then to increase his activity level at work      slowly.  He has also agreed to participate in the Via Study.  This      involves a novel medication that is directed at vascular inflammation.      He will undergo cardiac CT scanning at baseline and at a 6 month follow      up.  2. Dyslipidemia.  He has typical symptoms of statin induced myalgias.  I      will check a CPK today and discontinue his simvastatin.  We will try      him on Pravachol starting at 40 mg daily.  If he tolerates this we will       increase him to 80 mg.  He will need aggressive lipid lowering therapy      and I may consider trying him on Zetia in the future, but want to make      sure he can tolerate monotherapy with Pravachol first.  Will follow up      lipids and LFTs in 12 weeks. We will see him back in clinic after his      lipid studies are completed.     Veverly Fells. Excell Seltzer, MD  Electronically Signed    MDC/MedQ  DD: 12/25/2005  DT: 12/25/2005  Job #: [id number]

## 2010-11-14 LAB — TROPONIN I: Troponin I: 0.05

## 2010-11-14 LAB — CBC
HCT: 29.3 — ABNORMAL LOW
HCT: 30.2 — ABNORMAL LOW
HCT: 33.3 — ABNORMAL LOW
HCT: 35.3 — ABNORMAL LOW
Hemoglobin: 10.3 — ABNORMAL LOW
Hemoglobin: 11.6 — ABNORMAL LOW
Hemoglobin: 12.1 — ABNORMAL LOW
Hemoglobin: 9.9 — ABNORMAL LOW
MCHC: 33.7
MCHC: 34.1
MCHC: 34.2
MCV: 90.9
MCV: 91.1
MCV: 91.2
MCV: 91.9
MCV: 93
Platelets: 195
Platelets: 241
Platelets: 343
Platelets: 490 — ABNORMAL HIGH
Platelets: 502 — ABNORMAL HIGH
Platelets: 606 — ABNORMAL HIGH
RBC: 3.88 — ABNORMAL LOW
RBC: 3.89 — ABNORMAL LOW
RDW: 13.7
RDW: 14.2
RDW: 14.2
RDW: 14.3
RDW: 14.5
WBC: 12 — ABNORMAL HIGH
WBC: 14.5 — ABNORMAL HIGH
WBC: 7.4
WBC: 7.8

## 2010-11-14 LAB — PHOSPHORUS: Phosphorus: 2.3

## 2010-11-14 LAB — COMPREHENSIVE METABOLIC PANEL
ALT: 43
Albumin: 2 — ABNORMAL LOW
Albumin: 2.6 — ABNORMAL LOW
Alkaline Phosphatase: 67
BUN: 6
CO2: 25
CO2: 26
Calcium: 8.4
Creatinine, Ser: 1.16
Creatinine, Ser: 1.39
GFR calc Af Amer: >60
GFR calc non Af Amer: 58 — ABNORMAL LOW
Glucose, Bld: 103 — ABNORMAL HIGH
Glucose, Bld: 98
Potassium: 4
Sodium: 139
Total Bilirubin: 1.2
Total Protein: 5.4 — ABNORMAL LOW
Total Protein: 6.7
Total Protein: 7.1

## 2010-11-14 LAB — BASIC METABOLIC PANEL
BUN: 5 — ABNORMAL LOW
BUN: 6
BUN: 9
CO2: 22
CO2: 23
Calcium: 8.3 — ABNORMAL LOW
Calcium: 8.3 — ABNORMAL LOW
Chloride: 103
Chloride: 104
Chloride: 106
Creatinine, Ser: 0.78
Creatinine, Ser: 0.96
GFR calc Af Amer: >60
GFR calc Af Amer: >60
GFR calc Af Amer: >60
GFR calc non Af Amer: >60
GFR calc non Af Amer: >60
Glucose, Bld: 92
Glucose, Bld: 95
Glucose, Bld: 97
Potassium: 3.6
Potassium: 3.8
Potassium: 3.9
Potassium: 4.4
Sodium: 137
Sodium: 139

## 2010-11-14 LAB — DIFFERENTIAL
Basophils Absolute: 0
Basophils Absolute: 0
Basophils Relative: 0
Basophils Relative: 0
Basophils Relative: 0
Basophils Relative: 0
Eosinophils Absolute: 0
Eosinophils Absolute: 0.1
Eosinophils Relative: 0
Eosinophils Relative: 0
Eosinophils Relative: 0
Lymphocytes Relative: 2 — ABNORMAL LOW
Monocytes Relative: 2 — ABNORMAL LOW
Monocytes Relative: 6
Monocytes Relative: 7
Neutro Abs: 8.7 — ABNORMAL HIGH
Neutro Abs: 9.3 — ABNORMAL HIGH
Neutrophils Relative %: 76
Neutrophils Relative %: 82 — ABNORMAL HIGH
Neutrophils Relative %: 87 — ABNORMAL HIGH
Neutrophils Relative %: 96 — ABNORMAL HIGH

## 2010-11-14 LAB — CULTURE, BLOOD (ROUTINE X 2)
Culture: NO GROWTH
Culture: NO GROWTH

## 2010-11-14 LAB — LIPID PANEL
LDL Cholesterol: UNDETERMINED
Triglycerides: 430 — ABNORMAL HIGH

## 2010-11-14 LAB — URINE CULTURE: Culture: NO GROWTH

## 2010-11-14 LAB — URINALYSIS, ROUTINE W REFLEX MICROSCOPIC
Bilirubin Urine: NEGATIVE
Hgb urine dipstick: NEGATIVE
Nitrite: NEGATIVE
Specific Gravity, Urine: 1.006
Urobilinogen, UA: 0.2
Urobilinogen, UA: 2 — ABNORMAL HIGH
pH: 6.5

## 2010-11-14 LAB — CK TOTAL AND CKMB (NOT AT ARMC)
CK, MB: 0.8
Relative Index: INVALID

## 2010-11-14 LAB — PROTIME-INR
INR: 1.1
Prothrombin Time: 15

## 2010-11-14 LAB — CARDIAC PANEL(CRET KIN+CKTOT+MB+TROPI)
CK, MB: 0.2 — ABNORMAL LOW
CK, MB: 0.4
Total CK: 69
Troponin I: 0.01

## 2010-11-14 LAB — LACTIC ACID, PLASMA: Lactic Acid, Venous: 2.4 — ABNORMAL HIGH

## 2010-11-14 LAB — CLOSTRIDIUM DIFFICILE EIA: C difficile Toxins A+B, EIA: 13

## 2010-11-14 LAB — B-NATRIURETIC PEPTIDE (CONVERTED LAB): Pro B Natriuretic peptide (BNP): <30

## 2010-11-14 LAB — TSH: TSH: 0.618

## 2010-11-14 LAB — APTT: aPTT: 38 — ABNORMAL HIGH

## 2010-11-14 LAB — HIV ANTIBODY (ROUTINE TESTING W REFLEX): HIV: NONREACTIVE

## 2011-02-26 ENCOUNTER — Encounter (HOSPITAL_COMMUNITY): Payer: Self-pay | Admitting: Adult Health

## 2011-02-26 ENCOUNTER — Emergency Department (HOSPITAL_COMMUNITY): Payer: Self-pay

## 2011-02-26 ENCOUNTER — Emergency Department (HOSPITAL_COMMUNITY)
Admission: EM | Admit: 2011-02-26 | Discharge: 2011-02-27 | Disposition: A | Discharge status: Home / Self Care | Payer: Self-pay | Attending: Emergency Medicine | Admitting: Emergency Medicine

## 2011-02-26 DIAGNOSIS — Z7982 Long term (current) use of aspirin: Secondary | ICD-10-CM | POA: Insufficient documentation

## 2011-02-26 DIAGNOSIS — F1410 Cocaine abuse, uncomplicated: Secondary | ICD-10-CM | POA: Insufficient documentation

## 2011-02-26 DIAGNOSIS — R079 Chest pain, unspecified: Secondary | ICD-10-CM | POA: Insufficient documentation

## 2011-02-26 DIAGNOSIS — R209 Unspecified disturbances of skin sensation: Secondary | ICD-10-CM | POA: Insufficient documentation

## 2011-02-26 DIAGNOSIS — R5381 Other malaise: Secondary | ICD-10-CM | POA: Insufficient documentation

## 2011-02-26 DIAGNOSIS — R002 Palpitations: Secondary | ICD-10-CM | POA: Insufficient documentation

## 2011-02-26 DIAGNOSIS — F17200 Nicotine dependence, unspecified, uncomplicated: Secondary | ICD-10-CM | POA: Insufficient documentation

## 2011-02-26 DIAGNOSIS — I252 Old myocardial infarction: Secondary | ICD-10-CM | POA: Insufficient documentation

## 2011-02-26 HISTORY — DX: Acute myocardial infarction, unspecified: I21.9

## 2011-02-26 LAB — CBC
HCT: 43.5 % (ref 39.0–52.0)
MCH: 30.3 pg (ref 26.0–34.0)
MCHC: 33.3 g/dL (ref 30.0–36.0)
MCV: 91 fL (ref 78.0–100.0)
RDW: 14.1 % (ref 11.5–15.5)

## 2011-02-26 LAB — BASIC METABOLIC PANEL
BUN: 10 mg/dL (ref 6–23)
Calcium: 9.4 mg/dL (ref 8.4–10.5)
Creatinine, Ser: 0.92 mg/dL (ref 0.50–1.35)
GFR calc Af Amer: >90 mL/min (ref >90)
GFR calc non Af Amer: >90 mL/min (ref >90)
Potassium: 4.2 mEq/L (ref 3.5–5.1)

## 2011-02-26 LAB — CARDIAC PANEL(CRET KIN+CKTOT+MB+TROPI)
CK, MB: 2.3 ng/mL (ref 0.3–4.0)
Relative Index: 1.1 (ref 0.0–2.5)
Total CK: 219 U/L (ref 7–232)
Total CK: 208 U/L (ref 7–232)
Troponin I: <0.3 ng/mL (ref <0.30)

## 2011-02-26 MED ORDER — ASPIRIN 325 MG PO TABS
325.0000 mg | ORAL_TABLET | ORAL | Status: AC
  Administered 2011-02-26: 325 mg via ORAL
  Filled 2011-02-26: qty 1

## 2011-02-26 NOTE — ED Notes (Signed)
Pt aware of the need of a urine sample. 

## 2011-02-26 NOTE — ED Notes (Addendum)
Reports palpitations and right sided numbness. C/o right sided chest pain that started this am described as sharp associated with SOB the pain radiates all the way down to legs and then legs feel "tingling". Denies nausea.

## 2011-02-26 NOTE — ED Provider Notes (Signed)
History     CSN: [id number]  Arrival date & time 02/26/11  1325   First MD Initiated Contact with Patient 02/26/11 1434      Chief Complaint  Patient presents with  . Palpitations    (Consider location/radiation/quality/duration/timing/severity/associated sxs/prior treatment) Patient is a 41 y.o. male presenting with palpitations. The history is provided by the patient.  Palpitations  This is a recurrent problem. The current episode started more than 2 days ago. The problem has been resolved. The problem is associated with cocaine. Associated symptoms include numbness and chest pain. Pertinent negatives include no diaphoresis, no fever, no nausea, no vomiting, no back pain, no dizziness, no weakness, no cough and no shortness of breath.   Pt with history of MI and stent placement Has had CP described as tightness x 3 days associated with cocaine use. Currently is asymptomatic Past Medical History  Diagnosis Date  . Myocardial infarction     Past Surgical History  Procedure Date  . Coronary stent placement     History reviewed. No pertinent family history.  History  Substance Use Topics  . Smoking status: Current Everyday Smoker  . Smokeless tobacco: Not on file  . Alcohol Use: No      Review of Systems  Constitutional: Negative for fever, chills and diaphoresis.  HENT: Negative for neck pain and neck stiffness.   Eyes: Positive for visual disturbance.  Respiratory: Positive for chest tightness. Negative for cough, shortness of breath and wheezing.   Cardiovascular: Positive for chest pain and palpitations. Negative for leg swelling.  Gastrointestinal: Negative for nausea, vomiting, diarrhea, constipation and abdominal distention.  Genitourinary: Negative for dysuria.  Musculoskeletal: Negative for myalgias, back pain, joint swelling, arthralgias and gait problem.  Skin: Negative for pallor, rash and wound.  Neurological: Positive for numbness. Negative for  dizziness, weakness and light-headedness.    Allergies  Penicillins  Home Medications   Current Outpatient Rx  Name Route Sig Dispense Refill  . ASPIRIN EC 81 MG PO TBEC Oral Take 81 mg by mouth daily.      BP 103/64  Pulse 65  Temp 99 F (37.2 C)  Resp 20  SpO2 100%  Physical Exam  Nursing note and vitals reviewed. Constitutional: He is oriented to person, place, and time. He appears well-developed and well-nourished. No distress.  HENT:  Head: Normocephalic and atraumatic.  Mouth/Throat: Oropharynx is clear and moist.  Eyes: EOM are normal. Pupils are equal, round, and reactive to light.  Neck: Normal range of motion. Neck supple.  Cardiovascular: Normal rate and regular rhythm.   Pulmonary/Chest: Effort normal and breath sounds normal. No respiratory distress. He has no wheezes. He has no rales.  Abdominal: Soft. Bowel sounds are normal. He exhibits no distension and no mass. There is no tenderness. There is no rebound and no guarding.  Musculoskeletal: Normal range of motion. He exhibits no edema and no tenderness.  Neurological: He is alert and oriented to person, place, and time.       Sensation intact, 5/5 motor in all ext  Skin: Skin is warm and dry. No rash noted. No erythema.  Psychiatric: He has a normal mood and affect. His behavior is normal.    ED Course  Procedures (including critical care time)   Labs Reviewed  CBC  BASIC METABOLIC PANEL  POCT CARDIAC MARKERS  URINE RAPID DRUG SCREEN (HOSP PERFORMED)   Dg Chest 2 View  02/26/2011  *RADIOLOGY REPORT*  Clinical Data: Chest pain and shortness of breath.  CHEST - 2 VIEW  Comparison: PA and lateral chest 12/20/2008.  Findings: Lungs are clear.  Heart size is normal.  No pneumothorax or pleural effusion.  IMPRESSION: No acute disease.  Original Report Authenticated By: Bernadene Bell. Maricela Curet, M.D.     No diagnosis found.   Date: 02/26/2011  Rate: 67  Rhythm: normal sinus rhythm  QRS Axis: normal   Intervals: normal  ST/T Wave abnormalities: nonspecific ST changes  Conduction Disutrbances:none  Narrative Interpretation:   Old EKG Reviewed: none available   MDM   Pt requesting rehab for cocaine addiction  Given length of symptoms, if cardiac markers are neg x 2 unlikely any acute cardiac damage and will be cleared for ACT consult   Pt remains symptom-free. Cardiac enzymes x 2 negative. Medically cleared for ACT evaluation  Loren Racer, MD 03/01/11 1545

## 2011-02-26 NOTE — ED Provider Notes (Signed)
Medical screening examination performed by me:  Patient presenting with complaint of fluttering sensation in his chest. He states the symptoms began last night and are not present upon arrival to the ED. He specifically denied chest pain to me. He does have a history of MI with at least 2 cardiac stents. He denies shortness of breath no nausea no radiation of his symptoms. He states he feels generally weak no syncope. He does use cocaine and last use was 2 days ago.  EKG reassuring, labs, CXR ordered.  IV initiated and patient placed on cardiac monitor.    Ethelda Chick, MD 02/26/11 [phone]

## 2011-02-27 ENCOUNTER — Encounter (HOSPITAL_COMMUNITY): Payer: Self-pay | Admitting: Emergency Medicine

## 2011-02-27 NOTE — BH Assessment (Signed)
Assessment Note   Aaron Barajas is a 41 y.o. male who presents voluntarily at [facility] with request to detox from crack cocaine. This Clinical research associate informed pt that one can't detox from crack but that inpatient or outpatient substance abuse treatment is an option. Pt states he is interested in both inpt and outpt treatment. Pt reports his mood as "happy" when he isn't using and "depressed" while using. Pt reports he uses approx $100 of crack cocaine twice a week and has used for approx 10 years. Pt's current stressor is being a single father. Pt was pleasant with appropriate affect. Denies HI & SI. Denies AVH.   Axis I: Cocaine Abuse Axis II: Deferred Axis III:  Past Medical History  Diagnosis Date  . Myocardial infarction    Axis IV: problems with primary support group Axis V: 51-60 moderate symptoms  Past Medical History:  Past Medical History  Diagnosis Date  . Myocardial infarction     Past Surgical History  Procedure Date  . Coronary stent placement     Family History: History reviewed. No pertinent family history.  Social History:  reports that he has been smoking.  He does not have any smokeless tobacco history on file. He reports that he does not drink alcohol. His drug history not on file.  Additional Social History:  Alcohol / Drug Use Pain Medications: n/a Prescriptions: n/a Over the Counter: n/a History of alcohol / drug use?: Yes Substance #1 Name of Substance 1: crack cocaine 1 - Age of First Use: 30 1 - Amount (size/oz): average $100 per episode 1 - Frequency: twice a week or when he has $ for it 1 - Duration: 10 years  1 - Last Use / Amount: 02/23/10 - $100 worth Allergies:  Allergies  Allergen Reactions  . Penicillins Hives    Home Medications:  Medications Prior to Admission  Medication Dose Route Frequency Provider Last Rate Last Dose  . aspirin tablet 325 mg  325 mg Oral STAT Ethelda Chick, MD   325 mg at 02/26/11 1455   No current outpatient  prescriptions on file as of 02/26/2011.    OB/GYN Status:  No LMP for male patient.  General Assessment Data Location of Assessment: [facility] ED Living Arrangements: Children (63 yo son) Can pt return to current living arrangement?: Yes Admission Status: Voluntary Is patient capable of signing voluntary admission?: Yes Transfer from: Home Referral Source: Self/Family/Friend  Education Status Is patient currently in school?: No  Risk to self Suicidal Ideation: No Suicidal Intent: No Is patient at risk for suicide?: No Suicidal Plan?: No Access to Means: No What has been your use of drugs/alcohol within the last 12 months?: see social history -cocaine abuse Previous Attempts/Gestures: No How many times?: 0  Other Self Harm Risks: n/a Triggers for Past Attempts:  (n/a) Intentional Self Injurious Behavior: None Family Suicide History: No Recent stressful life event(s):  (none) Persecutory voices/beliefs?: No Depression: No Substance abuse history and/or treatment for substance abuse?: No Suicide prevention information given to non-admitted patients: Not applicable  Risk to Others Homicidal Ideation: No Thoughts of Harm to Others: No Current Homicidal Intent: No Current Homicidal Plan: No Access to Homicidal Means: No Identified Victim: n/a History of harm to others?: No Assessment of Violence: None Noted Violent Behavior Description: none Does patient have access to weapons?: No Criminal Charges Pending?: No Does patient have a court date: No  Psychosis Hallucinations: None noted Delusions: None noted  Mental Status Report Appear/Hygiene:  (appropriate) Eye  Contact: Good Motor Activity: Freedom of movement;Unremarkable Speech: Logical/coherent Level of Consciousness: Alert Mood:  (happy) Affect: Appropriate to circumstance Anxiety Level: None Thought Processes: Relevant;Coherent Judgement: Unimpaired Orientation: Appropriate for developmental  age;Situation;Time;Place;Person Obsessive Compulsive Thoughts/Behaviors: Minimal  Cognitive Functioning Concentration: Decreased Memory: Remote Intact;Recent Intact IQ: Average Insight: Good Impulse Control: Fair Appetite: Good Weight Loss: 0  Weight Gain: 0  Sleep: No Change Total Hours of Sleep: 7  Vegetative Symptoms: None  Prior Inpatient Therapy Prior Inpatient Therapy: No Prior Therapy Dates: n/a Prior Therapy Facilty/Provider(s): n/a Reason for Treatment: n/a  Prior Outpatient Therapy Prior Outpatient Therapy: No Prior Therapy Dates: n/a Prior Therapy Facilty/Provider(s): n/a Reason for Treatment: n/a  ADL Screening (condition at time of admission) Patient's cognitive ability adequate to safely complete daily activities?: Yes Patient able to express need for assistance with ADLs?: Yes Independently performs ADLs?: Yes Weakness of Legs: None Weakness of Arms/Hands: None       Abuse/Neglect Assessment (Assessment to be complete while patient is alone) Physical Abuse: Denies Verbal Abuse: Denies Sexual Abuse: Denies Exploitation of patient/patient's resources: Denies Self-Neglect: Denies          Additional Information 1:1 In Past 12 Months?: No CIRT Risk: No Elopement Risk: No Does patient have medical clearance?: Yes     Disposition:  Disposition Disposition of Patient: Inpatient treatment program;Outpatient treatment Type of inpatient treatment program:  (substance abuse) Type of outpatient treatment: [facility] - Intensive Outpatient  On Site Evaluation by:   Reviewed with Physician:     Donnamarie Rossetti P 02/27/2011 1:54 AM

## 2011-02-27 NOTE — ED Notes (Signed)
Act team saw the patient and recommended discharging on outpatient basis

## 2011-02-27 NOTE — ED Notes (Signed)
Patient stable upon discharge with family

## 2011-02-27 NOTE — Progress Notes (Signed)
This Clinical research associate discussed w/ pt his preference for outpatient or inpatient substance abuse. Pt reports he needs to do outpatient b/c he works full time. Writer gave pt list of referrals for [facility] and other mental health agencies who do substance abuse treatment. Writer notified pt's RN that referrals were given and that pt indicated he will contact resources.

## 2011-02-27 NOTE — ED Provider Notes (Signed)
  Physical Exam  BP 114/74  Pulse 62  Temp(Src) 97.6 F (36.4 C) (Oral)  Resp 18  SpO2 99%  Physical Exam  ED Course  Procedures  MDM Patient has been cleared by behavioral health assessment team. He does not want to and cocaine detox as he has a family that he needs to help. Vital signs are normal, will send home with followup information.      Vida Roller, MD 02/27/11 [phone]

## 2014-09-08 ENCOUNTER — Emergency Department (HOSPITAL_COMMUNITY)
Admission: EM | Admit: 2014-09-08 | Discharge: 2014-09-08 | Disposition: A | Discharge status: Home / Self Care | Payer: Self-pay | Attending: Emergency Medicine | Admitting: Emergency Medicine

## 2014-09-08 ENCOUNTER — Encounter (HOSPITAL_COMMUNITY): Payer: Self-pay | Admitting: Emergency Medicine

## 2014-09-08 DIAGNOSIS — M791 Myalgia: Secondary | ICD-10-CM | POA: Insufficient documentation

## 2014-09-08 DIAGNOSIS — Z720 Tobacco use: Secondary | ICD-10-CM | POA: Insufficient documentation

## 2014-09-08 DIAGNOSIS — I252 Old myocardial infarction: Secondary | ICD-10-CM | POA: Insufficient documentation

## 2014-09-08 DIAGNOSIS — H109 Unspecified conjunctivitis: Secondary | ICD-10-CM | POA: Insufficient documentation

## 2014-09-08 DIAGNOSIS — Z880 Allergy status to penicillin: Secondary | ICD-10-CM | POA: Insufficient documentation

## 2014-09-08 DIAGNOSIS — Z7982 Long term (current) use of aspirin: Secondary | ICD-10-CM | POA: Insufficient documentation

## 2014-09-08 MED ORDER — TETRACAINE HCL 0.5 % OP SOLN
2.0000 [drp] | Freq: Once | OPHTHALMIC | Status: AC
  Administered 2014-09-08: 2 [drp] via OPHTHALMIC
  Filled 2014-09-08: qty 2

## 2014-09-08 MED ORDER — POLYMYXIN B-TRIMETHOPRIM 10000-0.1 UNIT/ML-% OP SOLN
1.0000 [drp] | Freq: Once | OPHTHALMIC | Status: AC
  Administered 2014-09-08: 1 [drp] via OPHTHALMIC
  Filled 2014-09-08: qty 10

## 2014-09-08 MED ORDER — FLUORESCEIN SODIUM 1 MG OP STRP
1.0000 | ORAL_STRIP | Freq: Once | OPHTHALMIC | Status: AC
  Administered 2014-09-08: 1 via OPHTHALMIC
  Filled 2014-09-08: qty 1

## 2014-09-08 NOTE — ED Provider Notes (Signed)
CSN: [id number]     Arrival date & time 09/08/14  4098 History   First MD Initiated Contact with Patient 09/08/14 [phone]     Chief Complaint  Patient presents with  . Conjunctivitis  . Generalized Body Aches     (Consider location/radiation/quality/duration/timing/severity/associated sxs/prior Treatment) HPI Comments: Patient presents with complaint of red left eye with crusting and drainage for the past 2 days. He denies other symptoms including fever, swelling around the eye, redness around the eye, change in vision, URI symptoms, pain in the eye. Patient does have some itching. No treatments prior to arrival. No noted sick contacts. Onset of symptoms acute. Course is constant.  Patient is a 44 y.o. male presenting with conjunctivitis. The history is provided by the patient.  Conjunctivitis Associated symptoms include myalgias (occasionally while sleeping last night). Pertinent negatives include no abdominal pain, chest pain, coughing, fever, headaches, nausea, rash, sore throat or vomiting.    Past Medical History  Diagnosis Date  . Myocardial infarction    Past Surgical History  Procedure Laterality Date  . Coronary stent placement     No family history on file. History  Substance Use Topics  . Smoking status: Current Every Day Smoker -- 1.00 packs/day for 20 years  . Smokeless tobacco: Not on file  . Alcohol Use: No    Review of Systems  Constitutional: Negative for fever.  HENT: Negative for rhinorrhea and sore throat.   Eyes: Positive for discharge, redness and itching. Negative for photophobia, pain and visual disturbance.  Respiratory: Negative for cough.   Cardiovascular: Negative for chest pain.  Gastrointestinal: Negative for nausea, vomiting, abdominal pain and diarrhea.  Genitourinary: Negative for dysuria.  Musculoskeletal: Positive for myalgias (occasionally while sleeping last night).  Skin: Negative for rash.  Neurological: Negative for headaches.     Allergies  Penicillins  Home Medications   Prior to Admission medications   Medication Sig Start Date End Date Taking? Authorizing Provider  aspirin EC 81 MG tablet Take 81 mg by mouth daily.    Historical Provider, MD   BP 130/82 mmHg  Pulse 76  Temp(Src) 98.8 F (37.1 C) (Oral)  Resp 18  Ht 5\' 9"  (1.753 m)  Wt 150 lb (68.04 kg)  BMI 22.14 kg/m2  SpO2 98%   Physical Exam  Constitutional: He appears well-developed and well-nourished.  HENT:  Head: Normocephalic and atraumatic.  Right Ear: Tympanic membrane, external ear and ear canal normal.  Left Ear: Tympanic membrane, external ear and ear canal normal.  Nose: Nose normal. No mucosal edema.  Mouth/Throat: Oropharynx is clear and moist and mucous membranes are normal.  Eyes: Left eye exhibits discharge. Left eye exhibits no chemosis. Right conjunctiva is not injected. Left conjunctiva is injected.  Slit lamp exam:      The left eye shows no corneal abrasion, no corneal ulcer and no fluorescein uptake.  Neck: Normal range of motion. Neck supple.  Pulmonary/Chest: No respiratory distress.  Neurological: He is alert.  Skin: Skin is warm and dry.  Psychiatric: He has a normal mood and affect.  Nursing note and vitals reviewed.   ED Course  Procedures (including critical care time) Labs Review Labs Reviewed - No data to display  Imaging Review No results found.   EKG Interpretation None      Patient seen and examined.    Vital signs reviewed and are as follows: BP 130/82 mmHg  Pulse 76  Temp(Src) 98.8 F (37.1 C) (Oral)  Resp 18  Ht 5'  9" (1.753 m)  Wt 150 lb (68.04 kg)  BMI 22.14 kg/m2  SpO2 98%  D/c to home with Polytrim. Patient instructed to return with worsening pain, fever, redness or swelling around the eye. Encouraged ophthalmology follow-up in 2-3 days if not improved.  MDM   Final diagnoses:  Conjunctivitis of left eye   Patient with clinical conjunctivitis. No foreign bodies noted.  No surrounding erythema, swelling, vision changes/loss suspicious for orbital or periorbital cellulitis. No signs of iritis. No signs of glaucoma. No symptoms of retinal detachment. No ophthalmologic emergency suspected. Outpatient referral given in case of no improvement.      Renne Crigler, PA-C 09/08/14 0827  Blane Ohara, MD 09/08/14 [phone]

## 2014-09-08 NOTE — ED Notes (Signed)
Left eye tearing constantly, reddened, yellow discharge from eye in am, started yesterday-- also states has body aches, but just started with eye draining.

## 2014-09-08 NOTE — Discharge Instructions (Signed)
Please read and follow all provided instructions.  Your diagnoses today include:  1. Conjunctivitis of left eye    Tests performed today include:  Visual acuity testing to check your vision  Fluorescein dye examination to look for scratches on your eye  Vital signs. See below for your results today.   Medications prescribed:   Polytrim (polymyxin B/trimethoprim) - antibiotic eye drops  Use this medication as follows:  Use 1 drop in affected eye every 4 hours while awake for 10 days. Do not exceed 6 doses in 24 hours.  Take any prescribed medications only as directed.  Home care instructions:  Follow any educational materials contained in this packet. If you wear contact lenses, do not use them until your eye caregiver approves. Follow-up care is necessary to be sure the infection is healing if not completely resolved in 2-3 days. See your caregiver or eye specialist as suggested for followup.   If you have an eye infection, wash your hands often as this is very contagious and is easily spread from person to person.   Follow-up instructions: Please follow-up with your primary care doctor OR the opthalmologist listed in the next 2-3 days for further evaluation of your symptoms.  Return instructions:   Please return to the Emergency Department if you experience worsening symptoms.   Please return immediately if you develop severe pain, pus drainage, new change in vision, or fever.  Please return if you have any other emergent concerns.  Additional Information:  Your vital signs today were: BP 130/82 mmHg   Pulse 76   Temp(Src) 98.8 F (37.1 C) (Oral)   Resp 18   Ht  (1.753 m)   Wt 150 lb (68.04 kg)   BMI 22.14 kg/m2   SpO2 98% If your blood pressure (BP) was elevated above 135/85 this visit, please have this repeated by your doctor within one month. ---------------

## 2014-09-11 ENCOUNTER — Encounter (HOSPITAL_COMMUNITY): Payer: Self-pay | Admitting: Emergency Medicine

## 2014-09-11 ENCOUNTER — Emergency Department (HOSPITAL_COMMUNITY)
Admission: EM | Admit: 2014-09-11 | Discharge: 2014-09-11 | Disposition: A | Discharge status: Home / Self Care | Payer: Self-pay | Attending: Emergency Medicine | Admitting: Emergency Medicine

## 2014-09-11 DIAGNOSIS — H109 Unspecified conjunctivitis: Secondary | ICD-10-CM | POA: Insufficient documentation

## 2014-09-11 DIAGNOSIS — Z880 Allergy status to penicillin: Secondary | ICD-10-CM | POA: Insufficient documentation

## 2014-09-11 DIAGNOSIS — Z7982 Long term (current) use of aspirin: Secondary | ICD-10-CM | POA: Insufficient documentation

## 2014-09-11 DIAGNOSIS — I252 Old myocardial infarction: Secondary | ICD-10-CM | POA: Insufficient documentation

## 2014-09-11 DIAGNOSIS — Z9861 Coronary angioplasty status: Secondary | ICD-10-CM | POA: Insufficient documentation

## 2014-09-11 DIAGNOSIS — Z720 Tobacco use: Secondary | ICD-10-CM | POA: Insufficient documentation

## 2014-09-11 MED ORDER — HYDROCODONE-ACETAMINOPHEN 5-325 MG PO TABS: 2.0000 | ORAL_TABLET | ORAL | Status: DC | PRN

## 2014-09-11 NOTE — ED Notes (Signed)
Pt was here 7/26 for left eye infection. Was given drops but returns today for worsening swelling, redness and drainage.

## 2014-09-11 NOTE — ED Notes (Signed)
Pt verbalizes understanding of d/c instructions and denies any further needs at this time. 

## 2014-09-11 NOTE — Discharge Instructions (Signed)

## 2014-09-11 NOTE — ED Provider Notes (Signed)
CSN: [id number]     Arrival date & time 09/11/14  1316 History  This chart was scribed for non-physician practitioner, Langston Masker, PA-C, working with Mancel Bale, MD, by Ronney Lion, ED Scribe. This patient was seen in room TR04C/TR04C and the patient's care was started at 2:33 PM.   No chief complaint on file.  The history is provided by the patient. No language interpreter was used.   HPI Comments: Aaron Barajas is a 44 y.o. male with coronary stents, who presents to the Emergency Department complaining of constant left eye swelling and constant, worsening, sore left eye pain since taking cortisporin eyedrops prescribed for him at the ED 3 days ago (09/08/14) for eye redness that began 5 days ago. He was diagnosed with conjunctivitis but states the eye drops caused his symptoms to worsen. He denies any known trauma or injury to his eye. He also denies a history of any chronic medical conditions, including DM or HTN. He denies illicit drug use or EtOH consumption.    Past Medical History  Diagnosis Date  . Myocardial infarction    Past Surgical History  Procedure Laterality Date  . Coronary stent placement     No family history on file. History  Substance Use Topics  . Smoking status: Current Every Day Smoker -- 1.00 packs/day for 20 years  . Smokeless tobacco: Not on file  . Alcohol Use: No    Review of Systems  Eyes: Positive for pain, discharge (watery) and redness.  All other systems reviewed and are negative.   Allergies  Penicillins  Home Medications   Prior to Admission medications   Medication Sig Start Date End Date Taking? Authorizing Provider  aspirin EC 81 MG tablet Take 81 mg by mouth daily.    Historical Provider, MD   BP 110/76 mmHg  Pulse 74  Temp(Src) 98.5 F (36.9 C) (Oral)  Resp 18  Ht  (1.753 m)  Wt 150 lb (68.04 kg)  BMI 22.14 kg/m2  SpO2 99% Physical Exam  Constitutional: He is oriented to person, place, and time. He appears  well-developed and well-nourished. No distress.  HENT:  Head: Normocephalic and atraumatic.  Eyes: EOM are normal. Left eye exhibits discharge (tearing). Left conjunctiva is injected.  Injected, tearing left eye. Edematous eyelids.   Visual Acuity Right Eye Distance: 20/30 Left Eye Distance: 20/30 Bilateral Distance: 20/20  Neck: Neck supple. No tracheal deviation present.  Cardiovascular: Normal rate.   Pulmonary/Chest: Effort normal. No respiratory distress.  Musculoskeletal: Normal range of motion.  Neurological: He is alert and oriented to person, place, and time.  Skin: Skin is warm and dry.  Psychiatric: He has a normal mood and affect. His behavior is normal.  Nursing note and vitals reviewed.   ED Course  Procedures (including critical care time)  DIAGNOSTIC STUDIES: Oxygen Saturation is 99% on RA, normal by my interpretation.    COORDINATION OF CARE: 2:37 PM - Discussed treatment plan with pt at bedside which includes follow-up with ophthalmologist, and pt agreed to plan.  3:47 PM - Consult with ophthalmologist on-call, Dr. Marchelle Gearing, who recommended patient to stop using eyedrops. Continue with pain control for patient's symptoms. Dr. Dione Booze will see pt tomorrow at 3:30 in his office  4:02 PM - Discussed treatment plan with patient at bedside, and patient agreed to plan.   MDM   Final diagnoses:  Conjunctivitis of left eye     I personally performed the services in this documentation, which was scribed  in my presence.  The recorded information has been reviewed and considered.   Barnet Pall.   Lonia Skinner Lasara, PA-C 09/11/14 1705  Mancel Bale, MD 09/11/14 [phone]

## 2017-11-12 ENCOUNTER — Other Ambulatory Visit: Payer: Self-pay

## 2017-11-12 ENCOUNTER — Emergency Department (HOSPITAL_COMMUNITY)
Admission: EM | Admit: 2017-11-12 | Discharge: 2017-11-12 | Disposition: A | Discharge status: Home / Self Care | Payer: Self-pay | Attending: Emergency Medicine | Admitting: Emergency Medicine

## 2017-11-12 ENCOUNTER — Encounter (HOSPITAL_COMMUNITY): Payer: Self-pay | Admitting: Emergency Medicine

## 2017-11-12 ENCOUNTER — Emergency Department (HOSPITAL_COMMUNITY): Payer: Self-pay

## 2017-11-12 DIAGNOSIS — M5432 Sciatica, left side: Secondary | ICD-10-CM | POA: Insufficient documentation

## 2017-11-12 DIAGNOSIS — Z7982 Long term (current) use of aspirin: Secondary | ICD-10-CM | POA: Insufficient documentation

## 2017-11-12 DIAGNOSIS — F17200 Nicotine dependence, unspecified, uncomplicated: Secondary | ICD-10-CM | POA: Insufficient documentation

## 2017-11-12 DIAGNOSIS — M5417 Radiculopathy, lumbosacral region: Secondary | ICD-10-CM | POA: Insufficient documentation

## 2017-11-12 DIAGNOSIS — I252 Old myocardial infarction: Secondary | ICD-10-CM | POA: Insufficient documentation

## 2017-11-12 MED ORDER — HYDROCODONE-ACETAMINOPHEN 5-325 MG PO TABS
1.0000 | ORAL_TABLET | Freq: Once | ORAL | Status: AC
  Administered 2017-11-12: 1 via ORAL
  Filled 2017-11-12: qty 1

## 2017-11-12 MED ORDER — IBUPROFEN 600 MG PO TABS: 600.0000 mg | ORAL_TABLET | Freq: Four times a day (QID) | ORAL | 0 refills | Status: AC | PRN

## 2017-11-12 MED ORDER — IBUPROFEN 800 MG PO TABS
800.0000 mg | ORAL_TABLET | Freq: Once | ORAL | Status: AC
  Administered 2017-11-12: 800 mg via ORAL
  Filled 2017-11-12: qty 1

## 2017-11-12 MED ORDER — PREDNISONE 20 MG PO TABS: ORAL_TABLET | ORAL | 0 refills | Status: DC

## 2017-11-12 MED ORDER — PREDNISONE 20 MG PO TABS
60.0000 mg | ORAL_TABLET | Freq: Once | ORAL | Status: AC
  Administered 2017-11-12: 60 mg via ORAL
  Filled 2017-11-12: qty 3

## 2017-11-12 MED ORDER — HYDROCODONE-ACETAMINOPHEN 5-325 MG PO TABS: 1.0000 | ORAL_TABLET | Freq: Four times a day (QID) | ORAL | 0 refills | Status: AC | PRN

## 2017-11-12 NOTE — Discharge Instructions (Signed)
You have a disc that caused pinched nerved at L3.   Take prednisone as prescribed.   Take motrin for pain   Take vicodin for severe pain.   See neurosurgeon for follow up   No heavy lifting for 3 days   Return to ER if you have worse back pain, numbness, weakness, trouble urinating

## 2017-11-12 NOTE — ED Notes (Signed)
Pt reports left sided lower back pain x 1 week that radiates to left leg 9/10 shooting pain. Pt is alert and orinted x 4 and is verbally responsive. And is lying in bed

## 2017-11-12 NOTE — ED Provider Notes (Signed)
[facility]-EMERGENCY DEPT Provider Note   CSN: [id number] Arrival date & time: 11/12/17  1025     History   Chief Complaint Chief Complaint  Patient presents with  . Leg Pain  . Back Pain    HPI Aaron Barajas is a 47 y.o. male history of previous MI, previous back surgery here presenting with back pain, paresthesias down the left leg.  Patient states that he lifts heavy items for work.  He had sudden onset of worsening back pain about a week ago after heavy lifting.  He states that the pain radiated down his left leg.  Denies any numbness or weakness or trouble urinating.  Denies any fevers or chills.   The history is provided by the patient.    Past Medical History:  Diagnosis Date  . Myocardial infarction (HCC)     There are no active problems to display for this patient.   Past Surgical History:  Procedure Laterality Date  . CORONARY STENT PLACEMENT          Home Medications    Prior to Admission medications   Medication Sig Start Date End Date Taking? Authorizing Provider  aspirin EC 81 MG tablet Take 81 mg by mouth daily.   Yes [provider]  HYDROcodone-acetaminophen (NORCO/VICODIN) 5-325 MG per tablet Take 2 tablets by mouth every 4 (four) hours as needed. Patient not taking: Reported on 11/12/2017 09/11/14   Osie Cheeks    Family History No family history on file.  Social History Social History   Tobacco Use  . Smoking status: Current Every Day Smoker    Packs/day: 1.00    Years: 20.00    Pack years: 20.00  . Smokeless tobacco: Never Used  Substance Use Topics  . Alcohol use: No  . Drug use: No     Allergies   Penicillins   Review of Systems Review of Systems  Musculoskeletal: Positive for back pain.  All other systems reviewed and are negative.    Physical Exam Updated Vital Signs BP 129/86   Pulse (!) 58   Temp (!) 97.4 F (36.3 C) (Oral)   Resp 20   Ht 5\' 6"  (1.676 m)   Wt 61.7  kg   SpO2 100%   BMI 21.95 kg/m   Physical Exam  Constitutional: He is oriented to person, place, and time. He appears well-developed.  Slightly uncomfortable   HENT:  Head: Normocephalic.  Mouth/Throat: Oropharynx is clear and moist.  Eyes: Pupils are equal, round, and reactive to light. Conjunctivae and EOM are normal.  Neck: Normal range of motion. Neck supple.  Cardiovascular: Normal rate, regular rhythm and normal heart sounds.  Pulmonary/Chest: Effort normal and breath sounds normal. No stridor. No respiratory distress.  Abdominal: Soft. Bowel sounds are normal. He exhibits no distension. There is no tenderness.  Musculoskeletal:  Previous laminectomy scar healing well. No obvious deformity. + straight leg raise on L side. No saddle anesthesia. Nl reflexes bilaterally, 2+ pulses.   Neurological: He is alert and oriented to person, place, and time.  No saddle anesthesia. Nl reflexes, nl strength bilateral lower extremities   Skin: Skin is warm. Capillary refill takes less than 2 seconds.  Psychiatric: He has a normal mood and affect.  Nursing note and vitals reviewed.    ED Treatments / Results  Labs (all labs ordered are listed, but only abnormal results are displayed) Labs Reviewed - No data to display  EKG None  Radiology Ct Lumbar  Spine Wo Contrast  Result Date: 11/12/2017 CLINICAL DATA:  Left back and flank pain radiating into the left leg for more than 1 week. Recent lifting. Remote back surgery for ruptured disc as a teenager. EXAM: CT LUMBAR SPINE WITHOUT CONTRAST TECHNIQUE: Multidetector CT imaging of the lumbar spine was performed without intravenous contrast administration. Multiplanar CT image reconstructions were also generated. COMPARISON:  None. FINDINGS: Segmentation: There are 5 lumbar type vertebral bodies. Alignment: Normal. Vertebrae: No evidence of acute fracture or pars defect. 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 There are partially  bridging anterior osteophytes at both sacroiliac joints. Paraspinal and other soft tissues: No acute paraspinal abnormalities. There is age advanced aortoiliac atherosclerosis. Disc levels: No significant disc space findings from T11-12 through L1-2. L2-3: Annular disc bulging with anterior endplate osteophytes. There is a broad-based extraforaminal disc protrusion on the right which may encroach on the right L2 nerve root. There is mild facet and ligamentous hypertrophy which contribute to mild spinal stenosis and mild narrowing of the lateral recesses. The left foramen appears patent. L3-4: There is annular disc bulging with broad-based disc protrusions in the right subarticular zone and left extraforaminal spaces. There is facet and ligamentous hypertrophy. These factors contribute to moderate spinal stenosis with asymmetric narrowing of the right lateral recess and left foramen. There is possible encroachment on the right L4 and left L3 nerve roots. L4-5: Possible postsurgical changes at this level on the left. There is mild disc bulging and facet hypertrophy with mild narrowing of both lateral recesses. The foramina appear sufficiently patent. L5-S1: Mild disc bulging and bilateral facet hypertrophy. No significant spinal stenosis or nerve root encroachment. IMPRESSION: 1. In this patient with left radicular symptoms, the most likely source identified by this examination is a broad-based extraforaminal disc protrusion on the left at L3-4 causing probable left L3 nerve root encroachment. At this level, there is also a component in the right subarticular lateral recess which contributes to moderate spinal stenosis and asymmetric narrowing of the right lateral recess. 2. Extraforaminal disc protrusion on the right at L2-3 may encroach on the right L2 nerve root. 3. Possible postsurgical changes at L4-5 with mild narrowing of the lateral recesses. 4. Congenitally short pedicles. Electronically Signed   By: Carey Bullocks M.D.   On: 11/12/2017 13:23    Procedures Procedures (including critical care time)  Medications Ordered in ED Medications  ibuprofen (ADVIL,MOTRIN) tablet 800 mg (800 mg Oral Given 11/12/17 1321)  HYDROcodone-acetaminophen (NORCO/VICODIN) 5-325 MG per tablet 1 tablet (1 tablet Oral Given 11/12/17 1321)  predniSONE (DELTASONE) tablet 60 mg (60 mg Oral Given 11/12/17 1321)     Initial Impression / Assessment and Plan / ED Course  I have reviewed the triage vital signs and the nursing notes.  Pertinent labs & imaging results that were available during my care of the patient were reviewed by me and considered in my medical decision making (see chart for details).     Aaron Barajas is a 47 y.o. male here with back pain, paresthesias. Likely sciatica from protruding disc. He had previous spinal surgery so will get CT lumbar spine. No saddle anesthesia and he is neurologically intact. Will give pain meds, prednisone.   2:26 PM CT showed L3-4 disc with L3 nerve root encroachment. This is likely causing his sciatica symptoms. Will dc home with prednisone, pain meds, spine follow up.   Final Clinical Impressions(s) / ED Diagnoses   Final diagnoses:  None  ED Discharge Orders    None       Charlynne Pander, MD 11/12/17 1427

## 2017-11-12 NOTE — ED Triage Notes (Signed)
Pt c/o left back pain/flank pain with pains radiating down left leg for over week. Pt denies urinary problems. Report he has done lifting recently.

## 2019-05-13 IMAGING — CT CT L SPINE W/O CM
3 of 4 series · 12 of 33 positions shown, 14 images · non-contrast
Comparison: None.

CLINICAL DATA: Left back and flank pain radiating into the left leg
for more than 1 week. Recent lifting. Remote back surgery for
ruptured disc as a teenager.

EXAM:
CT LUMBAR SPINE WITHOUT CONTRAST
TECHNIQUE: Multidetector CT imaging of the lumbar spine was performed without
intravenous contrast administration. Multiplanar CT image
reconstructions were also generated.

[Series 3: l spine st · axial · 0.28mm/px · z∈[-191,-29]mm · 4 of 123 slices shown, 5 images]
[im 21/123  soft-tissue]
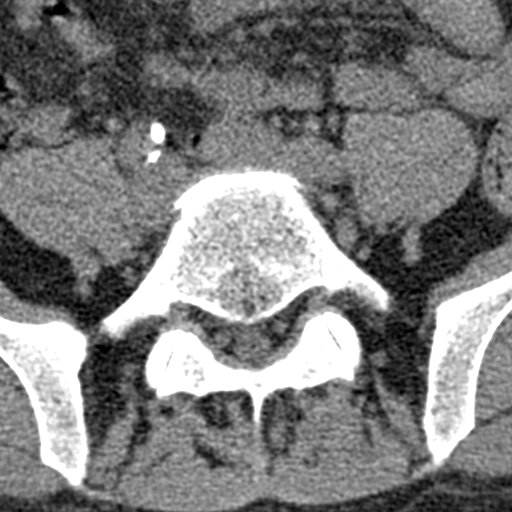
[im 21/123  bone]
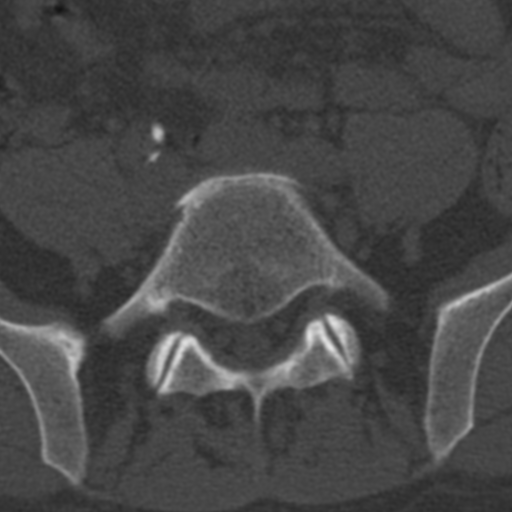
[im 41/123  bone]
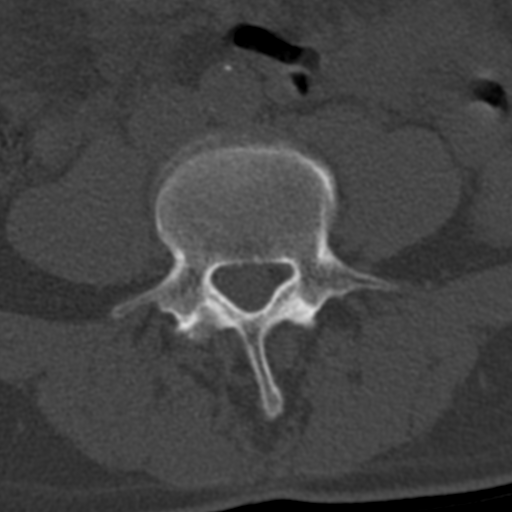
[im 82/123  bone]
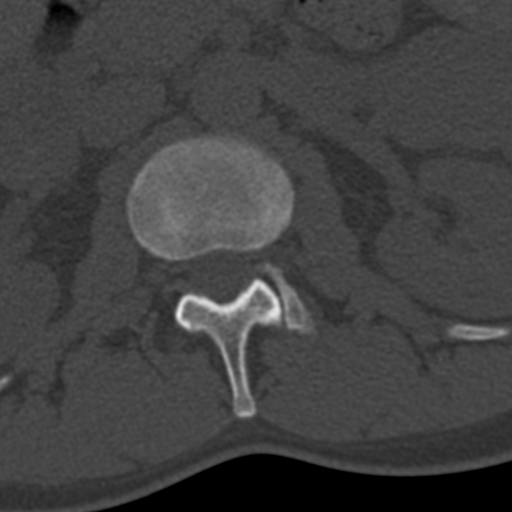
[im 102/123  bone]
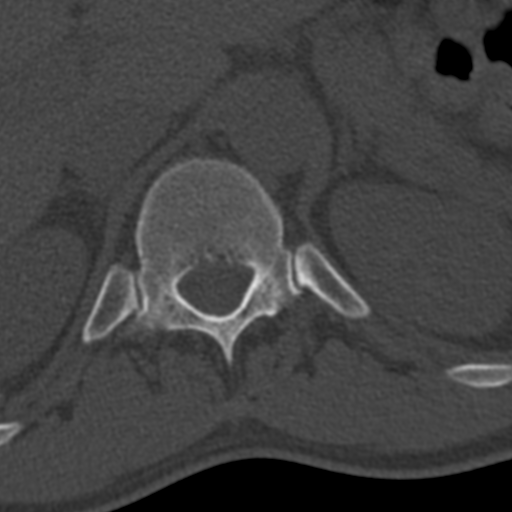

[Series 7: coronal bone · coronal · 0.36mm/px · 3 of 66 slices shown]
[im 14/66  bone]
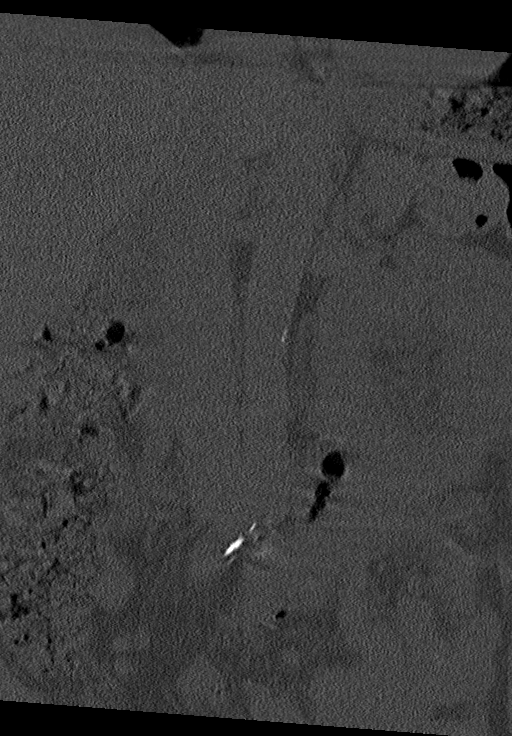
[im 27/66  bone]
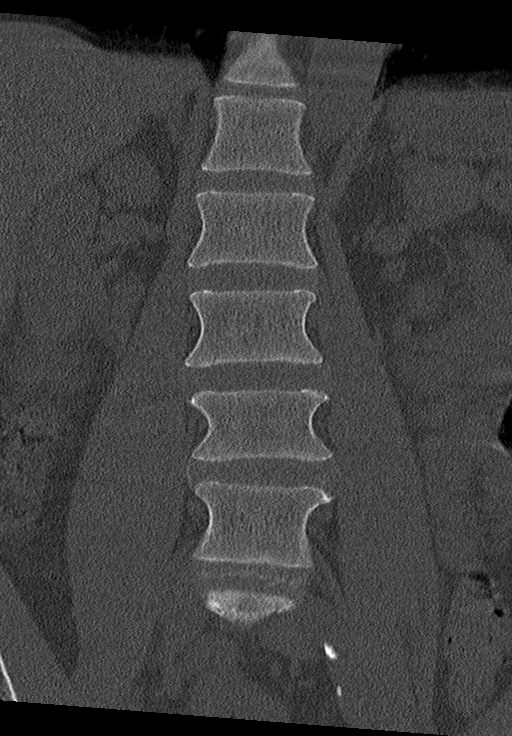
[im 40/66  bone]
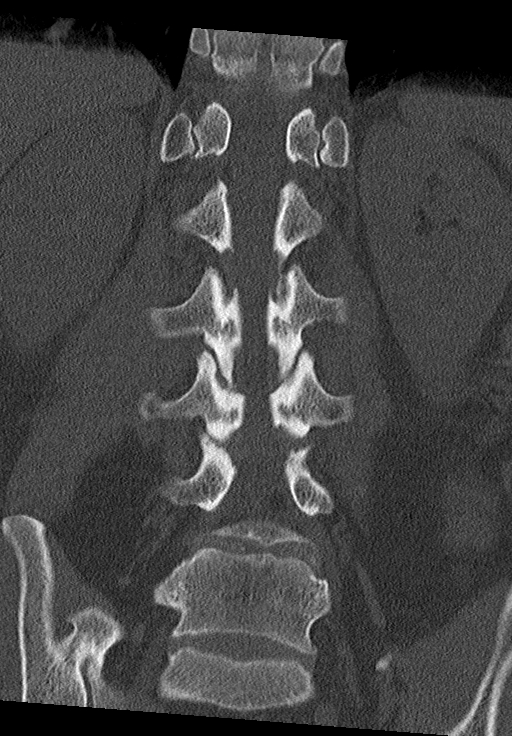

[Series 9: sagittal st · sagittal · 0.36mm/px · 5 of 68 slices shown, 6 images]
[im 23/68  bone]
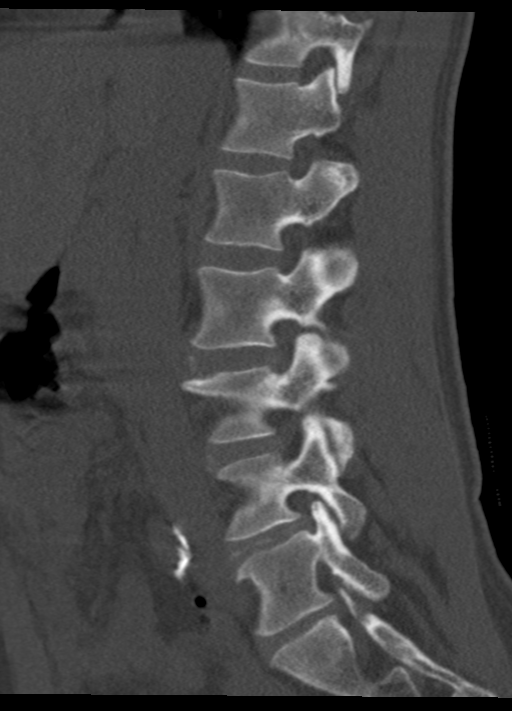
[im 28/68  bone]
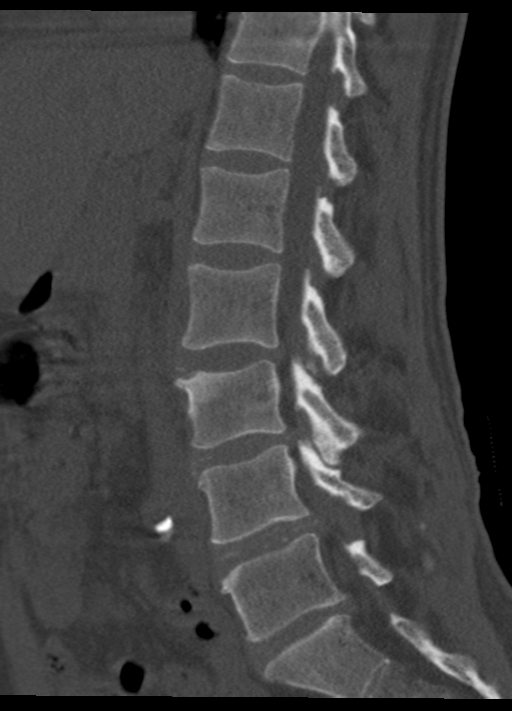
[im 34/68  soft-tissue]
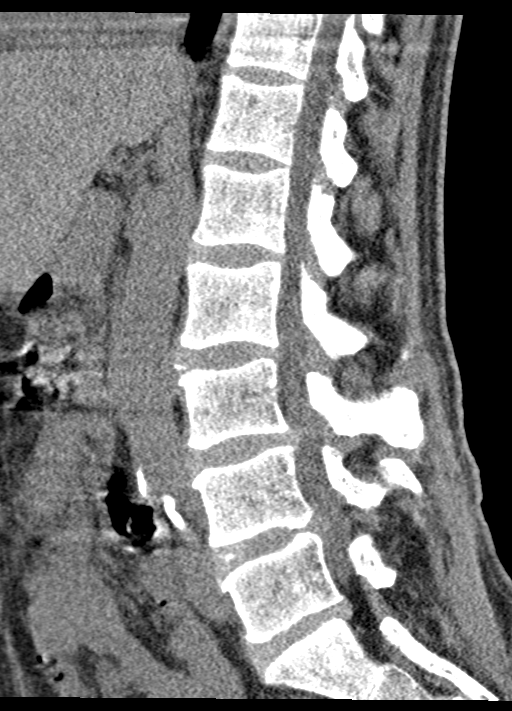
[im 34/68  bone]
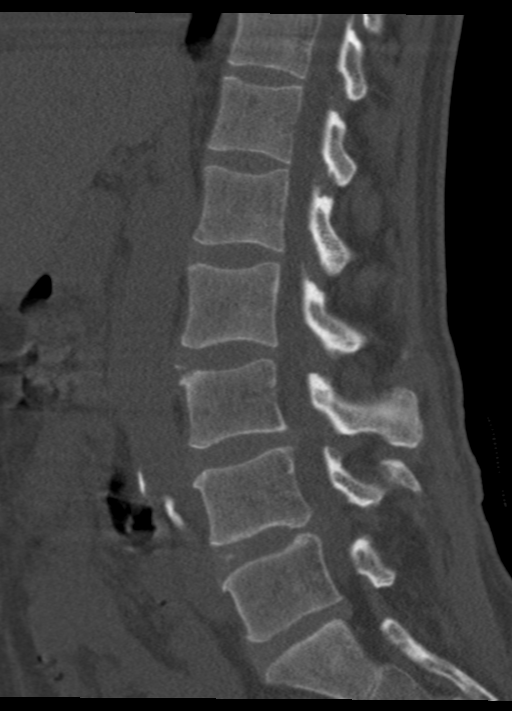
[im 40/68  bone]
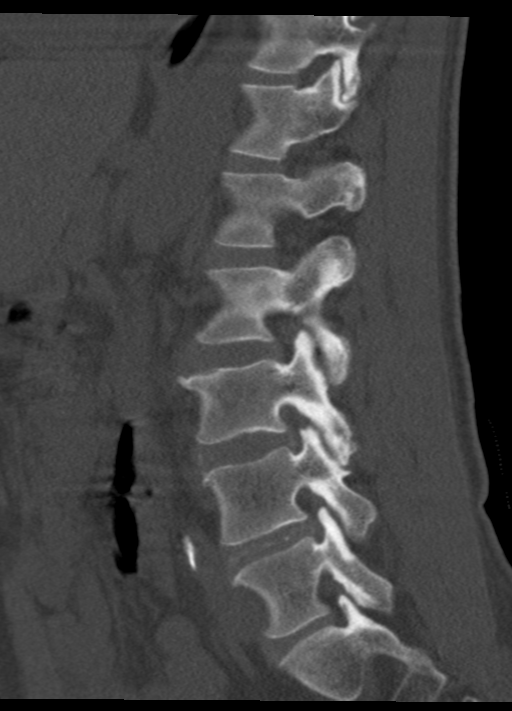
[im 45/68  bone]
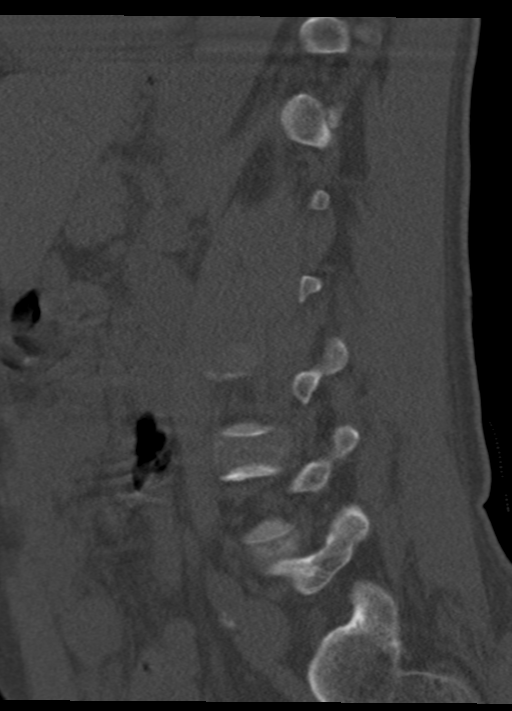

[12 of 33 positions shown; findings below may reference images not displayed]

FINDINGS: Segmentation: There are 5 lumbar type vertebral bodies.

Alignment: Normal.

Vertebrae: No evidence of acute fracture or pars defect. The lumbar
pedicles are short on a congenital basis. There are partially
bridging anterior osteophytes at both sacroiliac joints.

Paraspinal and other soft tissues: No acute paraspinal
abnormalities. There is age advanced aortoiliac atherosclerosis.

Disc levels:

No significant disc space findings from T11-12 through L1-2.

L2-3: Annular disc bulging with anterior endplate osteophytes. There
is a broad-based extraforaminal disc protrusion on the right which
may encroach on the right L2 nerve root. There is mild facet and
ligamentous hypertrophy which contribute to mild spinal stenosis and
mild narrowing of the lateral recesses. The left foramen appears
patent.

L3-4: There is annular disc bulging with broad-based disc
protrusions in the right subarticular zone and left extraforaminal
spaces. There is facet and ligamentous hypertrophy. These factors
contribute to moderate spinal stenosis with asymmetric narrowing of
the right lateral recess and left foramen. There is possible
encroachment on the right L4 and left L3 nerve roots.

L4-5: Possible postsurgical changes at this level on the left. There
is mild disc bulging and facet hypertrophy with mild narrowing of
both lateral recesses. The foramina appear sufficiently patent.

L5-S1: Mild disc bulging and bilateral facet hypertrophy. No
significant spinal stenosis or nerve root encroachment.
IMPRESSION: 1. In this patient with left radicular symptoms, the most likely
source identified by this examination is a broad-based
extraforaminal disc protrusion on the left at L3-4 causing probable
left L3 nerve root encroachment. At this level, there is also a
component in the right subarticular lateral recess which contributes
to moderate spinal stenosis and asymmetric narrowing of the right
lateral recess.
2. Extraforaminal disc protrusion on the right at L[DATE] encroach
on the right L2 nerve root.
3. Possible postsurgical changes at L4-5 with mild narrowing of the
lateral recesses.
4. Congenitally short pedicles.

## 2020-01-11 ENCOUNTER — Other Ambulatory Visit: Payer: Self-pay

## 2020-01-11 ENCOUNTER — Emergency Department (HOSPITAL_COMMUNITY)
Admission: EM | Admit: 2020-01-11 | Discharge: 2020-01-11 | Disposition: A | Discharge status: Home / Self Care | Payer: Self-pay | Attending: Emergency Medicine | Admitting: Emergency Medicine

## 2020-01-11 ENCOUNTER — Encounter (HOSPITAL_COMMUNITY): Payer: Self-pay | Admitting: Emergency Medicine

## 2020-01-11 DIAGNOSIS — M5431 Sciatica, right side: Secondary | ICD-10-CM | POA: Insufficient documentation

## 2020-01-11 DIAGNOSIS — F17210 Nicotine dependence, cigarettes, uncomplicated: Secondary | ICD-10-CM | POA: Insufficient documentation

## 2020-01-11 DIAGNOSIS — Z955 Presence of coronary angioplasty implant and graft: Secondary | ICD-10-CM | POA: Insufficient documentation

## 2020-01-11 DIAGNOSIS — Z7982 Long term (current) use of aspirin: Secondary | ICD-10-CM | POA: Insufficient documentation

## 2020-01-11 MED ORDER — PREDNISONE 20 MG PO TABS
60.0000 mg | ORAL_TABLET | Freq: Once | ORAL | Status: AC
  Administered 2020-01-11: 60 mg via ORAL
  Filled 2020-01-11: qty 3

## 2020-01-11 MED ORDER — PREDNISONE 20 MG PO TABS: 20.0000 mg | ORAL_TABLET | Freq: Two times a day (BID) | ORAL | 0 refills | Status: AC

## 2020-01-11 NOTE — ED Notes (Signed)
Have been calling pt for triage with no answer-

## 2020-01-11 NOTE — ED Notes (Signed)
Pt returned to ED lobby.  States he went upstairs after checking in because he didn't realize we were going to be fast today.

## 2020-01-11 NOTE — Discharge Instructions (Signed)
The pain in your buttocks and leg, might be sciatica but could also be a problem associated with nerve impingement in the lower back.  We are starting prednisone to help the discomfort.  If this does not relieve the discomfort within the next few days you need to call the listed neurosurgeon for follow-up appointment for further evaluation and treatment.  Also, try using heat on the sore area 3-4 times a day.

## 2020-01-11 NOTE — ED Triage Notes (Signed)
Pt reports R hip pain x 1 week that radiates down R leg.  No known injury.

## 2020-01-11 NOTE — ED Provider Notes (Signed)
[facility] EMERGENCY DEPARTMENT Provider Note   CSN: [id number] Arrival date & time: 01/11/20  1014     History Chief Complaint  Patient presents with  . Hip Pain  . Back Pain    Aaron Barajas is a 49 y.o. male.  HPI He complains of right buttock pain which started insidiously, 1 week ago, and radiates to leg has both a numbness and painful sensation.  He denies back pain.  He denies specific trauma.  He works as a Administrator.  He has previously had left-sided sciatica and prior lumbar surgery.  He does not currently see a neurosurgeon.  He has been using over-the-counter medications without relief.  No dysfunction of the bowel or bladder.  There are no other known modifying factors.    Past Medical History:  Diagnosis Date  . Myocardial infarction (HCC)     There are no problems to display for this patient.   Past Surgical History:  Procedure Laterality Date  . CORONARY STENT PLACEMENT         No family history on file.  Social History   Tobacco Use  . Smoking status: Current Every Day Smoker    Packs/day: 1.00    Years: 20.00    Pack years: 20.00  . Smokeless tobacco: Never Used  Vaping Use  . Vaping Use: Never used  Substance Use Topics  . Alcohol use: No  . Drug use: No    Home Medications Prior to Admission medications   Medication Sig Start Date End Date Taking? Authorizing Provider  aspirin EC 81 MG tablet Take 81 mg by mouth daily.    [provider]  HYDROcodone-acetaminophen (NORCO/VICODIN) 5-325 MG tablet Take 1 tablet by mouth every 6 (six) hours as needed. 11/12/17   Charlynne Pander, MD  ibuprofen (ADVIL,MOTRIN) 600 MG tablet Take 1 tablet (600 mg total) by mouth every 6 (six) hours as needed. 11/12/17   Charlynne Pander, MD  predniSONE (DELTASONE) 20 MG tablet Take 1 tablet (20 mg total) by mouth 2 (two) times daily. 01/11/20   Mancel Bale, MD    Allergies    Penicillins  Review of Systems   Review  of Systems  All other systems reviewed and are negative.   Physical Exam Updated Vital Signs BP (!) 136/100 (BP Location: Right Arm)   Pulse 68   Temp 98.2 F (36.8 C) (Oral)   Resp 16   SpO2 99%   Physical Exam Vitals and nursing note reviewed.  Constitutional:      General: He is not in acute distress.    Appearance: He is well-developed. He is not ill-appearing, toxic-appearing or diaphoretic.  HENT:     Head: Normocephalic and atraumatic.     Right Ear: External ear normal.     Left Ear: External ear normal.  Eyes:     Conjunctiva/sclera: Conjunctivae normal.     Pupils: Pupils are equal, round, and reactive to light.  Neck:     Trachea: Phonation normal.  Cardiovascular:     Rate and Rhythm: Normal rate.  Pulmonary:     Effort: Pulmonary effort is normal.  Abdominal:     General: There is no distension.     Tenderness: There is no abdominal tenderness.  Musculoskeletal:        General: Normal range of motion.     Cervical back: Normal range of motion and neck supple.     Comments: Normal range of motion arms, legs, lower back.  Skin:    General: Skin is warm and dry.  Neurological:     Mental Status: He is alert and oriented to person, place, and time.     Cranial Nerves: No cranial nerve deficit.     Sensory: No sensory deficit.     Motor: No abnormal muscle tone.     Coordination: Coordination normal.  Psychiatric:        Mood and Affect: Mood normal.        Behavior: Behavior normal.        Thought Content: Thought content normal.        Judgment: Judgment normal.     ED Results / Procedures / Treatments   Labs (all labs ordered are listed, but only abnormal results are displayed) Labs Reviewed - No data to display  EKG None  Radiology No results found.  Procedures Procedures (including critical care time)  Medications Ordered in ED Medications  predniSONE (DELTASONE) tablet 60 mg (has no administration in time range)    ED Course  I  have reviewed the triage vital signs and the nursing notes.  Pertinent labs & imaging results that were available during my care of the patient were reviewed by me and considered in my medical decision making (see chart for details).    MDM Rules/Calculators/A&P                           Patient Vitals for the past 24 hrs:  BP Temp Temp src Pulse Resp SpO2  01/11/20 1056 (!) 136/100 98.2 F (36.8 C) Oral 68 16 99 %    11:31 AM Reevaluation with update and discussion. After initial assessment and treatment, an updated evaluation reveals he is comfortable has no further complaints.  Findings discussed and questions answered. Mancel Bale   Medical Decision Making:  This patient is presenting for evaluation of sciatic-like pain, which does require a range of treatment options, and is a complaint that involves a moderate risk of morbidity and mortality. The differential diagnoses include lumbar radiculopathy, sciatica, hip pain. I decided to review old records, and in summary middle-aged male, prior back surgery and sciatica presenting for evaluation of atraumatic pain in right buttock and right leg with numbness of the right leg.  I did not require additional historical information from anyone.    Critical Interventions-clinical evaluation.  Discussion with patient  After These Interventions, the Patient was reevaluated and was found stable for discharge.  Differential diagnosis includes lumbar radiculopathy and sciatica.  Doubt arthropathy of the hip as source of pain.  Doubt lumbar myelopathy.  CRITICAL CARE-no Performed by: Mancel Bale  Nursing Notes Reviewed/ Care Coordinated Applicable Imaging Reviewed Interpretation of Laboratory Data incorporated into ED treatment  The patient appears reasonably screened and/or stabilized for discharge and I doubt any other medical condition or other [facility] requiring further screening, evaluation, or treatment in the ED at this time prior to  discharge.  Plan: Home Medications-continue OTC medication such as Tylenol; Home Treatments-heat to affected area; return here if the recommended treatment, does not improve the symptoms; Recommended follow up-PCP, as needed.  Follow-up neurosurgery if persistent symptoms of sciatica or concern for lumbar problems.     Final Clinical Impression(s) / ED Diagnoses Final diagnoses:  Sciatica of right side    Rx / DC Orders ED Discharge Orders         Ordered    predniSONE (DELTASONE) 20 MG tablet  2 times daily  01/11/20 1127           Mancel Bale, MD 01/11/20 1131
